# Patient Record
Sex: Female | Born: 1945 | Race: White | Hispanic: No | Marital: Married | Smoking: Never smoker
Health system: Southern US, Community
[De-identification: ages and names within clinical notes are randomized; demographics above are authoritative.]

## PROBLEM LIST (undated history)

## (undated) DIAGNOSIS — N921 Excessive and frequent menstruation with irregular cycle: Secondary | ICD-10-CM

## (undated) DIAGNOSIS — Z9289 Personal history of other medical treatment: Secondary | ICD-10-CM

## (undated) HISTORY — DX: Excessive and frequent menstruation with irregular cycle: N92.1

## (undated) HISTORY — DX: Personal history of other medical treatment: Z92.89

---

## 1989-06-24 HISTORY — PX: MANDIBLE SURGERY: SHX707

## 1989-08-24 HISTORY — PX: COMBINED HYSTEROSCOPY DIAGNOSTIC / D&C: SUR297

## 1996-03-26 HISTORY — PX: SHOULDER ARTHROSCOPY: SHX128

## 1997-09-25 ENCOUNTER — Other Ambulatory Visit: Admission: RE | Admit: 1997-09-25 | Discharge: 1997-09-25 | Payer: Self-pay | Admitting: *Deleted

## 1998-08-11 ENCOUNTER — Emergency Department (HOSPITAL_COMMUNITY): Admission: EM | Admit: 1998-08-11 | Discharge: 1998-08-11 | Payer: Self-pay | Admitting: Emergency Medicine

## 1998-10-13 ENCOUNTER — Other Ambulatory Visit: Admission: RE | Admit: 1998-10-13 | Discharge: 1998-10-13 | Payer: Self-pay | Admitting: *Deleted

## 1999-10-14 ENCOUNTER — Other Ambulatory Visit: Admission: RE | Admit: 1999-10-14 | Discharge: 1999-10-14 | Payer: Self-pay | Admitting: *Deleted

## 2000-10-17 ENCOUNTER — Other Ambulatory Visit: Admission: RE | Admit: 2000-10-17 | Discharge: 2000-10-17 | Payer: Self-pay | Admitting: *Deleted

## 2001-10-18 ENCOUNTER — Other Ambulatory Visit: Admission: RE | Admit: 2001-10-18 | Discharge: 2001-10-18 | Payer: Self-pay | Admitting: *Deleted

## 2002-10-24 ENCOUNTER — Other Ambulatory Visit: Admission: RE | Admit: 2002-10-24 | Discharge: 2002-10-24 | Payer: Self-pay | Admitting: *Deleted

## 2003-01-25 HISTORY — PX: KNEE ARTHROSCOPY: SHX127

## 2003-08-25 HISTORY — PX: KNEE ARTHROSCOPY: SHX127

## 2003-10-14 ENCOUNTER — Other Ambulatory Visit: Admission: RE | Admit: 2003-10-14 | Discharge: 2003-10-14 | Payer: Self-pay | Admitting: *Deleted

## 2004-10-14 ENCOUNTER — Other Ambulatory Visit: Admission: RE | Admit: 2004-10-14 | Discharge: 2004-10-14 | Payer: Self-pay | Admitting: *Deleted

## 2005-10-14 ENCOUNTER — Other Ambulatory Visit: Admission: RE | Admit: 2005-10-14 | Discharge: 2005-10-14 | Payer: Self-pay | Admitting: Obstetrics & Gynecology

## 2006-06-02 ENCOUNTER — Ambulatory Visit: Payer: Self-pay | Admitting: Internal Medicine

## 2006-06-15 ENCOUNTER — Ambulatory Visit: Payer: Self-pay | Admitting: Internal Medicine

## 2006-10-05 ENCOUNTER — Other Ambulatory Visit: Admission: RE | Admit: 2006-10-05 | Discharge: 2006-10-05 | Payer: Self-pay | Admitting: Obstetrics & Gynecology

## 2007-10-11 ENCOUNTER — Other Ambulatory Visit: Admission: RE | Admit: 2007-10-11 | Discharge: 2007-10-11 | Payer: Self-pay | Admitting: Obstetrics & Gynecology

## 2011-01-28 LAB — HM PAP SMEAR: HM Pap smear: NORMAL

## 2012-03-26 HISTORY — PX: MOHS SURGERY: SUR867

## 2012-10-30 ENCOUNTER — Ambulatory Visit (INDEPENDENT_AMBULATORY_CARE_PROVIDER_SITE_OTHER): Payer: 59 | Admitting: Family Medicine

## 2012-10-30 VITALS — BP 140/80 | HR 87 | Temp 98.0°F | Resp 18 | Ht 66.0 in | Wt 173.0 lb

## 2012-10-30 DIAGNOSIS — J069 Acute upper respiratory infection, unspecified: Secondary | ICD-10-CM

## 2012-10-30 DIAGNOSIS — J029 Acute pharyngitis, unspecified: Secondary | ICD-10-CM

## 2012-10-30 DIAGNOSIS — Z9109 Other allergy status, other than to drugs and biological substances: Secondary | ICD-10-CM

## 2012-10-30 LAB — POCT RAPID STREP A (OFFICE): Rapid Strep A Screen: NEGATIVE

## 2012-10-30 MED ORDER — AZITHROMYCIN 250 MG PO TABS: ORAL_TABLET | ORAL | Status: DC

## 2012-10-30 NOTE — Progress Notes (Signed)
  [facility]:  Office Visit  Chief Complaint:  Chief Complaint  Patient presents with  . Sore Throat    x 1 week     HPI: Elizabeth Moses is a 67 y.o. female who complains of   Sore throat x 1 week, she also has bumps along the inside of her oral mucosa History of fever blisters 10 years ago.  Has been around grandkids She is going on vacation to celebrate 49st anniversary and wants to get well  History reviewed. No pertinent past medical history. History reviewed. No pertinent past surgical history. History   Social History  . Marital Status: Married    Spouse Name: N/A    Number of Children: N/A  . Years of Education: N/A   Social History Main Topics  . Smoking status: Never Smoker   . Smokeless tobacco: None  . Alcohol Use: No  . Drug Use: No  . Sexually Active: None   Other Topics Concern  . None   Social History Narrative  . None   Family History  Problem Relation Age of Onset  . Heart disease Mother   . Heart disease Father    Allergies  Allergen Reactions  . Lodine (Etodolac)   . Penicillins    Prior to Admission medications   Not on File     ROS: The patient denies fevers, chills, night sweats, unintentional weight loss, chest pain, palpitations, wheezing, dyspnea on exertion, nausea, vomiting, abdominal pain, dysuria, hematuria, melena, numbness, weakness, or tingling.   All other systems have been reviewed and were otherwise negative with the exception of those mentioned in the HPI and as above.    PHYSICAL EXAM: Filed Vitals:   10/30/12 1137  BP: 140/80  Pulse: 87  Temp: 98 F (36.7 C)  Resp: 18   Filed Vitals:   10/30/12 1137  Height: 5\' 6"  (1.676 m)  Weight: 173 lb (78.472 kg)   Body mass index is 27.94 kg/(m^2).  General: Alert, no acute distress HEENT:  Normocephalic, atraumatic, oropharynx patent. + erythematous throat,no exudates. TM nl.  Cardiovascular:  Regular rate and rhythm, no rubs murmurs or  gallops.  No Carotid bruits, radial pulse intact. No pedal edema.  Respiratory: Clear to auscultation bilaterally.  No wheezes, rales, or rhonchi.  No cyanosis, no use of accessory musculature GI: No organomegaly, abdomen is soft and non-tender, positive bowel sounds.  No masses. Skin: No rashes. Neurologic: Facial musculature symmetric. Psychiatric: Patient is appropriate throughout our interaction. Lymphatic: No cervical lymphadenopathy Musculoskeletal: Gait intact.   LABS: Results for orders placed in visit on 10/30/12  POCT RAPID STREP A (OFFICE)      Result Value Range   Rapid Strep A Screen Negative  Negative     EKG/XRAY:   Primary read interpreted by Dr. Conley Rolls at [facility]   ASSESSMENT/PLAN: Encounter Diagnoses  Name Primary?  . Acute pharyngitis Yes  . History of environmental allergies   . URI, acute    Most likely allergy or viral related but patient is going out of town She is going to [place], [place] anniversary Sxs treatment first If no improvement then take abx Rx azithromycin Take otc antihistaime without D Gross sideeffects, risk and benefits, and alternatives of medications d/w patient. Patient is aware that all medications have potential sideeffects and we are unable to predict every sideeffect or drug-drug interaction that may occur. F/u prn   Berdena Cisek PHUONG, DO 10/30/2012 1:04 PM

## 2012-10-30 NOTE — Patient Instructions (Signed)
Allergies, Generic  Allergies may happen from anything your body is sensitive to. This may be food, medicines, pollens, chemicals, and nearly anything around you in everyday life that produces allergens. An allergen is anything that causes an allergy producing substance. Heredity is often a factor in causing these problems. This means you may have some of the same allergies as your parents.  Food allergies happen in all age groups. Food allergies are some of the most severe and life threatening. Some common food allergies are cow's milk, seafood, eggs, nuts, wheat, and soybeans.  SYMPTOMS    Swelling around the mouth.   An itchy red rash or hives.   Vomiting or diarrhea.   Difficulty breathing.  SEVERE ALLERGIC REACTIONS ARE LIFE-THREATENING.  This reaction is called anaphylaxis. It can cause the mouth and throat to swell and cause difficulty with breathing and swallowing. In severe reactions only a trace amount of food (for example, peanut oil in a salad) may cause death within seconds.  Seasonal allergies occur in all age groups. These are seasonal because they usually occur during the same season every year. They may be a reaction to molds, grass pollens, or tree pollens. Other causes of problems are house dust mite allergens, pet dander, and mold spores. The symptoms often consist of nasal congestion, a runny itchy nose associated with sneezing, and tearing itchy eyes. There is often an associated itching of the mouth and ears. The problems happen when you come in contact with pollens and other allergens. Allergens are the particles in the air that the body reacts to with an allergic reaction. This causes you to release allergic antibodies. Through a chain of events, these eventually cause you to release histamine into the blood stream. Although it is meant to be protective to the body, it is this release that causes your discomfort. This is why you were given anti-histamines to feel better. If you are  unable to pinpoint the offending allergen, it may be determined by skin or blood testing. Allergies cannot be cured but can be controlled with medicine.  Hay fever is a collection of all or some of the seasonal allergy problems. It may often be treated with simple over-the-counter medicine such as diphenhydramine. Take medicine as directed. Do not drink alcohol or drive while taking this medicine. Check with your caregiver or package insert for child dosages.  If these medicines are not effective, there are many new medicines your caregiver can prescribe. Stronger medicine such as nasal spray, eye drops, and corticosteroids may be used if the first things you try do not work well. Other treatments such as immunotherapy or desensitizing injections can be used if all else fails. Follow up with your caregiver if problems continue. These seasonal allergies are usually not life threatening. They are generally more of a nuisance that can often be handled using medicine.  HOME CARE INSTRUCTIONS    If unsure what causes a reaction, keep a diary of foods eaten and symptoms that follow. Avoid foods that cause reactions.   If hives or rash are present:   Take medicine as directed.   You may use an over-the-counter antihistamine (diphenhydramine) for hives and itching as needed.   Apply cold compresses (cloths) to the skin or take baths in cool water. Avoid hot baths or showers. Heat will make a rash and itching worse.   If you are severely allergic:   Following a treatment for a severe reaction, hospitalization is often required for closer follow-up.     use an anaphylaxis kit.  If you have had a severe reaction, always carry your anaphylaxis kit or EpiPen with you. Use this medicine as directed by your caregiver if a severe reaction is occurring. Failure to do so could have a fatal outcome. SEEK  MEDICAL CARE IF:  You suspect a food allergy. Symptoms generally happen within 30 minutes of eating a food.  Your symptoms have not gone away within 2 days or are getting worse.  You develop new symptoms.  You want to retest yourself or your child with a food or drink you think causes an allergic reaction. Never do this if an anaphylactic reaction to that food or drink has happened before. Only do this under the care of a caregiver. SEEK IMMEDIATE MEDICAL CARE IF:   You have difficulty breathing, are wheezing, or have a tight feeling in your chest or throat.  You have a swollen mouth, or you have hives, swelling, or itching all over your body.  You have had a severe reaction that has responded to your anaphylaxis kit or an EpiPen. These reactions may return when the medicine has worn off. These reactions should be considered life threatening. MAKE SURE YOU:   Understand these instructions.  Will watch your condition.  Will get help right away if you are not doing well or get worse. Document Released: 07/06/2002 Document Revised: 07/05/2011 Document Reviewed: 12/11/2007 [facility] Patient Information 2014 [place], [place]. Viral and Bacterial Pharyngitis Pharyngitis is soreness (inflammation) or infection of the pharynx. It is also called a sore throat. CAUSES  Most sore throats are caused by viruses and are part of a cold. However, some sore throats are caused by strep and other bacteria. Sore throats can also be caused by post nasal drip from draining sinuses, allergies and sometimes from sleeping with an open mouth. Infectious sore throats can be spread from person to person by coughing, sneezing and sharing cups or eating utensils. TREATMENT  Sore throats that are viral usually last 3-4 days. Viral illness will get better without medications (antibiotics). Strep throat and other bacterial infections will usually begin to get better about 24-48 hours after you begin to take  antibiotics. HOME CARE INSTRUCTIONS   If the caregiver feels there is a bacterial infection or if there is a positive strep test, they will prescribe an antibiotic. The full course of antibiotics must be taken. If the full course of antibiotic is not taken, you or your child may become ill again. If you or your child has strep throat and do not finish all of the medication, serious heart or kidney diseases may develop.  Drink enough water and fluids to keep your urine clear or pale yellow.  Only take over-the-counter or prescription medicines for pain, discomfort or fever as directed by your caregiver.  Get lots of rest.  Gargle with salt water ( tsp. of salt in a glass of water) as often as every 1-2 hours as you need for comfort.  Hard candies may soothe the throat if individual is not at risk for choking. Throat sprays or lozenges may also be used. SEEK MEDICAL CARE IF:   Large, tender lumps in the neck develop.  A rash develops.  Green, yellow-brown or bloody sputum is coughed up.  Your baby is older than 3 months with a rectal temperature of 100.5 F (38.1 C) or higher for more than 1 day. SEEK IMMEDIATE MEDICAL CARE IF:   A stiff neck develops.  You or your child are drooling  or unable to swallow liquids.  You or your child are vomiting, unable to keep medications or liquids down.  You or your child has severe pain, unrelieved with recommended medications.  You or your child are having difficulty breathing (not due to stuffy nose).  You or your child are unable to fully open your mouth.  You or your child develop redness, swelling, or severe pain anywhere on the neck.  You have a fever.  Your baby is older than 3 months with a rectal temperature of 102 F (38.9 C) or higher.  Your baby is 38 months old or younger with a rectal temperature of 100.4 F (38 C) or higher. MAKE SURE YOU:   Understand these instructions.  Will watch your condition.  Will get help  right away if you are not doing well or get worse. Document Released: 04/12/2005 Document Revised: 07/05/2011 Document Reviewed: 07/10/2007 [facility] Patient Information 2014 [place], [place].

## 2013-05-15 ENCOUNTER — Encounter: Payer: Self-pay | Admitting: Obstetrics & Gynecology

## 2013-05-17 ENCOUNTER — Ambulatory Visit (INDEPENDENT_AMBULATORY_CARE_PROVIDER_SITE_OTHER): Payer: 59 | Admitting: Obstetrics & Gynecology

## 2013-05-17 ENCOUNTER — Encounter: Payer: Self-pay | Admitting: Obstetrics & Gynecology

## 2013-05-17 VITALS — BP 144/82 | HR 72 | Resp 16 | Ht 65.0 in | Wt 160.6 lb

## 2013-05-17 DIAGNOSIS — Z124 Encounter for screening for malignant neoplasm of cervix: Secondary | ICD-10-CM

## 2013-05-17 DIAGNOSIS — Z01419 Encounter for gynecological examination (general) (routine) without abnormal findings: Secondary | ICD-10-CM

## 2013-05-17 NOTE — Progress Notes (Signed)
68 y.o. [id number] MarriedCaucasianF here for annual exam.  Doing well.  No vaginal bleeding.  Patient so kind and asked lots of questions about my son.  She just went to dermatologist on Tuesday--1/19.  Has several areas biopsies.    Oldest grandson getting married in [place] in the spring.    Has appt with Dr. Felicity CoyerLeschber in June.  No blood work today.    Patient's last menstrual period was 04/27/1995.          Sexually active: no  The current method of family planning is none.    Exercising: yes  tennis, bike, weight, aerobics, and walking Smoker:  no  Health Maintenance: Pap:  01/28/11 WNL History of abnormal Pap:  no MMG:  04/10/13 3D-normal Colonoscopy:  2008 repeat in 10 years, Dr. Juanda ChanceBrodie BMD:   2008 TDaP:  02/17/12 Screening Labs: PCP, Hb today: PCP, Urine today: PCP   reports that she has never smoked. She has never used smokeless tobacco. She reports that she does not drink alcohol or use illicit drugs.  Past Medical History  Diagnosis Date  . Menometrorrhagia 05/1988  . History of blood transfusion     As a child from father    Past Surgical History  Procedure Laterality Date  . Mandible surgery  06/1989    Reconstructive  . Combined hysteroscopy diagnostic / d&c  08/1989    Benign  . Shoulder arthroscopy Left 03/1996  . Knee arthroscopy Right 01/2003  . Knee arthroscopy Left 08/2003  . Colonoscopy  05/2006    F/U in 10 years  . Mohs surgery  12/13    on forehead  . Skin biopsy  7/14  . Skin biopsy  05/15/13    Current Outpatient Prescriptions  Medication Sig Dispense Refill  . Multiple Vitamin (MULTIVITAMIN) tablet Take 1 tablet by mouth daily.      . Omega-3 Fatty Acids (FISH OIL PO) Take by mouth daily.       No current facility-administered medications for this visit.    Family History  Problem Relation Age of Onset  . Heart disease Mother   . Hypertension Mother   . Thyroid disease Mother   . Heart disease Father   . Epilepsy Brother   . Heart  disease Maternal Grandfather [id number]  . Heart disease Paternal Grandfather [id number]  . Other Sister     breast biopsy    ROS:  Pertinent items are noted in HPI.  Otherwise, a comprehensive ROS was negative.  Exam:   BP 144/82  Pulse 72  Resp 16  Ht 5\' 5"  (1.651 m)  Wt 160 lb 9.6 oz (72.848 kg)  BMI 26.73 kg/m2  LMP 04/27/1995  Weight change: +3lbs  Height: 5\' 5"  (165.1 cm)  Ht Readings from Last 3 Encounters:  05/17/13 5\' 5"  (1.651 m)  10/30/12 5\' 6"  (1.676 m)    General appearance: alert, cooperative and appears stated age Head: Normocephalic, without obvious abnormality, atraumatic Neck: no adenopathy, supple, symmetrical, trachea midline and thyroid normal to inspection and palpation Lungs: clear to auscultation bilaterally Breasts: normal appearance, no masses or tenderness Heart: regular rate and rhythm Abdomen: soft, non-tender; bowel sounds normal; no masses,  no organomegaly Extremities: extremities normal, atraumatic, no cyanosis or edema Skin: Skin color, texture, turgor normal. No rashes or lesions Lymph nodes: Cervical, supraclavicular, and axillary nodes normal. No abnormal inguinal nodes palpated Neurologic: Grossly normal   Pelvic: External genitalia:  no lesions  Urethra:  normal appearing urethra with no masses, tenderness or lesions              Bartholins and Skenes: normal                 Vagina: normal appearing vagina with normal color and discharge, no lesions              Cervix: no lesions              Pap taken: yes Bimanual Exam:  Uterus:  normal size, contour, position, consistency, mobility, non-tender              Adnexa: normal adnexa and no mass, fullness, tenderness               Rectovaginal: Confirms               Anus:  normal sphincter tone, no lesions  A:  Well Woman with normal exam PMP. No HRT Dense breasts, grade 3 H/O SCCs of skin  P:   Mammogram yearly.  3D discussed. pap smear obtained today.  Pt aware of guidelines and  requests that we do not stop Pap smears completely. Labs will be done with Dr. Trish Mage this summer.  Will discuss vaccines with her as well.  Tdap done 2013. return annually or prn  An After Visit Summary was printed and given to the patient.

## 2013-05-18 LAB — IPS PAP SMEAR ONLY

## 2013-08-28 ENCOUNTER — Ambulatory Visit (INDEPENDENT_AMBULATORY_CARE_PROVIDER_SITE_OTHER): Payer: 59 | Admitting: Certified Nurse Midwife

## 2013-08-28 ENCOUNTER — Encounter: Payer: Self-pay | Admitting: Certified Nurse Midwife

## 2013-08-28 VITALS — BP 124/70 | HR 76 | Temp 97.9°F | Resp 16 | Ht 65.0 in | Wt 158.0 lb

## 2013-08-28 DIAGNOSIS — N390 Urinary tract infection, site not specified: Secondary | ICD-10-CM

## 2013-08-28 LAB — POCT URINALYSIS DIPSTICK
BILIRUBIN UA: NEGATIVE
Blood, UA: NEGATIVE
Glucose, UA: NEGATIVE
KETONES UA: NEGATIVE
NITRITE UA: NEGATIVE
PH UA: 5
PROTEIN UA: NEGATIVE
Urobilinogen, UA: NEGATIVE

## 2013-08-28 MED ORDER — NITROFURANTOIN MONOHYD MACRO 100 MG PO CAPS: 100.0000 mg | ORAL_CAPSULE | Freq: Two times a day (BID) | ORAL | Status: DC

## 2013-08-28 NOTE — Progress Notes (Signed)
Reviewed personally.  M. Suzanne Seyed Heffley, MD.  

## 2013-08-28 NOTE — Patient Instructions (Signed)
Urinary Tract Infection  Urinary tract infections (UTIs) can develop anywhere along your urinary tract. Your urinary tract is your body's drainage system for removing wastes and extra water. Your urinary tract includes two kidneys, two ureters, a bladder, and a urethra. Your kidneys are a pair of bean-shaped organs. Each kidney is about the size of your fist. They are located below your ribs, one on each side of your spine.  CAUSES  Infections are caused by microbes, which are microscopic organisms, including fungi, viruses, and bacteria. These organisms are so small that they can only be seen through a microscope. Bacteria are the microbes that most commonly cause UTIs.  SYMPTOMS   Symptoms of UTIs may vary by age and gender of the patient and by the location of the infection. Symptoms in young women typically include a frequent and intense urge to urinate and a painful, burning feeling in the bladder or urethra during urination. Older women and men are more likely to be tired, shaky, and weak and have muscle aches and abdominal pain. A fever may mean the infection is in your kidneys. Other symptoms of a kidney infection include pain in your back or sides below the ribs, nausea, and vomiting.  DIAGNOSIS  To diagnose a UTI, your caregiver will ask you about your symptoms. Your caregiver also will ask to provide a urine sample. The urine sample will be tested for bacteria and white blood cells. White blood cells are made by your body to help fight infection.  TREATMENT   Typically, UTIs can be treated with medication. Because most UTIs are caused by a bacterial infection, they usually can be treated with the use of antibiotics. The choice of antibiotic and length of treatment depend on your symptoms and the type of bacteria causing your infection.  HOME CARE INSTRUCTIONS   If you were prescribed antibiotics, take them exactly as your caregiver instructs you. Finish the medication even if you feel better after you  have only taken some of the medication.   Drink enough water and fluids to keep your urine clear or pale yellow.   Avoid caffeine, tea, and carbonated beverages. They tend to irritate your bladder.   Empty your bladder often. Avoid holding urine for long periods of time.   Empty your bladder before and after sexual intercourse.   After a bowel movement, women should cleanse from front to back. Use each tissue only once.  SEEK MEDICAL CARE IF:    You have back pain.   You develop a fever.   Your symptoms do not begin to resolve within 3 days.  SEEK IMMEDIATE MEDICAL CARE IF:    You have severe back pain or lower abdominal pain.   You develop chills.   You have nausea or vomiting.   You have continued burning or discomfort with urination.  MAKE SURE YOU:    Understand these instructions.   Will watch your condition.   Will get help right away if you are not doing well or get worse.  Document Released: 01/20/2005 Document Revised: 10/12/2011 Document Reviewed: 05/21/2011  [employer] Patient Information 2014 [employer].

## 2013-08-28 NOTE — Progress Notes (Signed)
68 y.o.married white female [id number] here with complaint of UTI, with onset  on 3 days ago. Patient complaining of urinary frequency/urgency/ and pain with urination. Patient denies fever, chills, nausea or back pain. No new personal products. Patient not sexual activity. Denies any vaginal symptoms or new personal products.  Menopausal with slight vaginal dryness.   O: Healthy female WDWN Affect: Normal, orientation x 3 Skin : warm and dry CVAT: negative bilateral Abdomen: positive for suprapubic tenderness  Pelvic exam: External genital area: normal, no lesions Bladder,Urethra, Urethral meatus: tender,meatus slightly red Vagina: normal vaginal discharge, slight atrophic appearance  Cervix: normal, non tender Uterus:normal,non tender Adnexa: normal non tender, no fullness or masses  Poct urine-wbc trace A: UTI ? Vaginal dyness  P: Reviewed findings of UTI ZH:[id number]:Macrobid see order NGE:XBMWULab:Urine micro, culture Reviewed warning signs and symptoms of UTI Encouraged to limit soda, tea, and coffee and increase water RV 2 week if TOC needed for positive culture Discussed vaginal findings and encouraged Olive oil use 2 x weekly for moisture . Patient will try after UTI treated.  RV prn

## 2013-08-29 LAB — URINALYSIS, MICROSCOPIC ONLY
Bacteria, UA: NONE SEEN
CASTS: NONE SEEN
CRYSTALS: NONE SEEN
Squamous Epithelial / LPF: NONE SEEN

## 2013-08-29 LAB — URINE CULTURE
COLONY COUNT: NO GROWTH
Organism ID, Bacteria: NO GROWTH

## 2013-08-30 ENCOUNTER — Telehealth: Payer: Self-pay

## 2013-08-30 NOTE — Telephone Encounter (Signed)
Message copied by Eliezer BottomJOHNSON, Gasper Hopes J on Thu Aug 30, 2013  2:29 PM ------      Message from: Verner CholLEONARD, DEBORAH S      Created: Thu Aug 30, 2013  8:22 AM       Notify patent that urine culture and micro negative, but symptomatic when in, so complete medication. Vaginal dryness contributing problem. No TOC needed.      Patent status ------

## 2013-08-30 NOTE — Telephone Encounter (Signed)
lmtcb

## 2013-08-31 NOTE — Telephone Encounter (Signed)
Spoke with patient. Results given as seen below. Patient agreeable and verbalizes understanding.  Routing to provider for final review. Patient agreeable to disposition. Will close encounter   

## 2013-08-31 NOTE — Telephone Encounter (Signed)
Patient is returning joys call but she isnt here could you help patient. Said she may just have to call back because she is playing in a tennis match at the moment

## 2013-08-31 NOTE — Telephone Encounter (Signed)
Left message to call Elizabeth Moses at [phone]. 

## 2013-09-28 ENCOUNTER — Ambulatory Visit: Payer: Self-pay | Admitting: Internal Medicine

## 2014-02-21 ENCOUNTER — Ambulatory Visit (INDEPENDENT_AMBULATORY_CARE_PROVIDER_SITE_OTHER): Payer: 59 | Admitting: Certified Nurse Midwife

## 2014-02-21 ENCOUNTER — Encounter: Payer: Self-pay | Admitting: Certified Nurse Midwife

## 2014-02-21 VITALS — BP 120/80 | HR 72 | Resp 16 | Ht 65.0 in | Wt 148.0 lb

## 2014-02-21 DIAGNOSIS — N390 Urinary tract infection, site not specified: Secondary | ICD-10-CM

## 2014-02-21 MED ORDER — NITROFURANTOIN MONOHYD MACRO 100 MG PO CAPS: 100.0000 mg | ORAL_CAPSULE | Freq: Two times a day (BID) | ORAL | Status: DC

## 2014-02-21 NOTE — Patient Instructions (Signed)

## 2014-02-21 NOTE — Progress Notes (Signed)
Reviewed personally.  M. Suzanne Adelee Hannula, MD.  

## 2014-02-21 NOTE — Progress Notes (Signed)
68 y.o. married white female [id number] here with complaint of urinary symptoms of burning and throbbing in urethral area.  Denies urinary frequency or urgency or incontinence. Denies fever,chills or back pain. Patient does have vaginal dryness and uses OTC prn. Patient very athletic "sweats a lot". Has a tennis competition coming up soon. No other issues today.  O:Healthy female WDWN Affect: normal, orientation x 3  Exam:Skin warm and dry CVAT negative bilateral Abdomen:slight suprapubic Lymph node: no enlargement or tenderness Pelvic exam: External genital:atrophic appearance Bladder,urethra tender, urethral meatus red, tender Vagina: scant discharge noted.  Cervix: normal, non tender Uterus: normal, non tender Adnexa:normal, non tender, no masses or fullness noted  POCT urine RBC 1+ WBC 2+   A:Urethritis  R/O UTI   P:Discussed findings of urethritis and etiology. Discussed menopausal changes and moisture can contribute to occurrence. Instructed to increase Coconut oil use around vaginal opening and change out of wet underwear more frequently when playing sports. Increase water intake and decrease coffee and tea. UTI warning symptoms reviewed. Rx: Macrobid see order Lab Urine micro,culture  Rv prn

## 2014-02-22 LAB — URINALYSIS, MICROSCOPIC ONLY
BACTERIA UA: NONE SEEN
Casts: NONE SEEN
Crystals: NONE SEEN
Squamous Epithelial / LPF: NONE SEEN

## 2014-02-23 LAB — URINE CULTURE
Colony Count: NO GROWTH
Organism ID, Bacteria: NO GROWTH

## 2014-02-25 ENCOUNTER — Encounter: Payer: Self-pay | Admitting: Certified Nurse Midwife

## 2014-05-30 ENCOUNTER — Ambulatory Visit (INDEPENDENT_AMBULATORY_CARE_PROVIDER_SITE_OTHER): Payer: Medicare Other | Admitting: Obstetrics & Gynecology

## 2014-05-30 ENCOUNTER — Encounter: Payer: Self-pay | Admitting: Obstetrics & Gynecology

## 2014-05-30 VITALS — BP 158/82 | HR 80 | Resp 16 | Ht 65.25 in | Wt 143.8 lb

## 2014-05-30 DIAGNOSIS — Z0000 Encounter for general adult medical examination without abnormal findings: Secondary | ICD-10-CM

## 2014-05-30 DIAGNOSIS — E785 Hyperlipidemia, unspecified: Secondary | ICD-10-CM

## 2014-05-30 DIAGNOSIS — E038 Other specified hypothyroidism: Secondary | ICD-10-CM

## 2014-05-30 DIAGNOSIS — Z01419 Encounter for gynecological examination (general) (routine) without abnormal findings: Secondary | ICD-10-CM

## 2014-05-30 LAB — COMPREHENSIVE METABOLIC PANEL
ALBUMIN: 4.2 g/dL (ref 3.5–5.2)
ALT: 11 U/L (ref 0–35)
AST: 19 U/L (ref 0–37)
Alkaline Phosphatase: 61 U/L (ref 39–117)
BUN: 15 mg/dL (ref 6–23)
CO2: 27 mEq/L (ref 19–32)
Calcium: 9.8 mg/dL (ref 8.4–10.5)
Chloride: 103 mEq/L (ref 96–112)
Creat: 0.94 mg/dL (ref 0.50–1.10)
Glucose, Bld: 94 mg/dL (ref 70–99)
Potassium: 4 mEq/L (ref 3.5–5.3)
SODIUM: 138 meq/L (ref 135–145)
TOTAL PROTEIN: 7.3 g/dL (ref 6.0–8.3)
Total Bilirubin: 0.6 mg/dL (ref 0.2–1.2)

## 2014-05-30 LAB — LIPID PANEL
Cholesterol: 199 mg/dL (ref 0–200)
HDL: 56 mg/dL (ref >39)
LDL Cholesterol: 124 mg/dL — ABNORMAL HIGH (ref 0–99)
Total CHOL/HDL Ratio: 3.6 Ratio
Triglycerides: 97 mg/dL (ref <150)
VLDL: 19 mg/dL (ref 0–40)

## 2014-05-30 NOTE — Progress Notes (Signed)
69 y.o. [id number] MarriedCaucasianF here for annual exam.  Doing well.  No vaginal bleeding.  Family doing well.  Lucila [place]son has finished [employer].  Will be [place] to [place] in the summer.  Exercising regularly.  Have highly recommend pt establish care with PCP.  Pt had appt with Dr. Felicity CoyerLeschber but didn't go last year due to grandson's graduation from [employer].    Pt checks BP at home.  Values are all 110's-120's/60's-70's.  Lost 17 pounds from last visit.  She has purposefully worked on this.    Patient's last menstrual period was 04/27/1995.          Sexually active: No.  The current method of family planning is post menopausal status.    Exercising: Yes.     Smoker:  no  Health Maintenance: Pap:  05/17/13 WNL History of abnormal Pap:  no MMG:  05/28/14 3D-normal Colonoscopy:  2008-repeat in 10 years BMD:   2008 TDaP:  02/17/12 Screening Labs: done today, Hb today: 13.9, Urine today: unable to give sample   reports that she has never smoked. She has never used smokeless tobacco. She reports that she does not drink alcohol or use illicit drugs.  Past Medical History  Diagnosis Date  . Menometrorrhagia 05/1988  . History of blood transfusion     As a child from father    Past Surgical History  Procedure Laterality Date  . Mandible surgery  06/1989    Reconstructive  . Combined hysteroscopy diagnostic / d&c  08/1989    Benign  . Shoulder arthroscopy Left 03/1996  . Knee arthroscopy Right 01/2003  . Knee arthroscopy Left 08/2003  . Mohs surgery  12/13    on forehead    Current Outpatient Prescriptions  Medication Sig Dispense Refill  . B Complex Vitamins (B-COMPLEX/B-12 PO) Take by mouth as needed.    . Coenzyme Q10 (CO Q 10 PO) Take by mouth daily.    . Multiple Vitamin (MULTIVITAMIN) tablet Take 1 tablet by mouth daily.    . Omega-3 Fatty Acids (FISH OIL PO) Take by mouth daily.    Marland Kitchen. VITAMIN E PO Take by mouth daily.     No current facility-administered  medications for this visit.    Family History  Problem Relation Age of Onset  . Heart disease Mother   . Hypertension Mother   . Thyroid disease Mother   . Heart disease Father   . Epilepsy Brother   . Heart disease Maternal Grandfather [id number]  . Heart disease Paternal Grandfather [id number]  . Other Sister     breast biopsy  . Osteoporosis Sister     ROS:  Pertinent items are noted in HPI.  Otherwise, a comprehensive ROS was negative.  Exam:   BP 158/82 mmHg  Pulse 80  Resp 16  Ht 5' 5.25" (1.657 m)  Wt 143 lb 12.8 oz (65.227 kg)  BMI 23.76 kg/m2  LMP 04/27/1995    Height: 5' 5.25" (165.7 cm)  Ht Readings from Last 3 Encounters:  05/30/14 5' 5.25" (1.657 m)  02/21/14 5\' 5"  (1.651 m)  08/28/13 5\' 5"  (1.651 m)    General appearance: alert, cooperative and appears stated age Head: Normocephalic, without obvious abnormality, atraumatic Neck: no adenopathy, supple, symmetrical, trachea midline and thyroid normal to inspection and palpation Lungs: clear to auscultation bilaterally Breasts: normal appearance, no masses or tenderness Heart: regular rate and rhythm Abdomen: soft, non-tender; bowel sounds normal; no masses,  no organomegaly Extremities: extremities normal, atraumatic, no cyanosis or  edema Skin: Skin color, texture, turgor normal. No rashes or lesions Lymph nodes: Cervical, supraclavicular, and axillary nodes normal. No abnormal inguinal nodes palpated Neurologic: Grossly normal   Pelvic: External genitalia:  no lesions              Urethra:  normal appearing urethra with no masses, tenderness or lesions              Bartholins and Skenes: normal                 Vagina: normal appearing vagina with normal color and discharge, no lesions              Cervix: no lesions              Pap taken: No. Bimanual Exam:  Uterus:  normal size, contour, position, consistency, mobility, non-tender              Adnexa: normal adnexa and no mass, fullness, tenderness                Rectovaginal: Confirms               Anus:  normal sphincter tone, no lesions  Chaperone was present for exam.  A:  Well Woman with normal exam PMP. No HRT Dense breasts, grade 3 H/O SCCs of skin Mildly elevated lipids  P: Mammogram yearly. Doing 3D MMG.   Pap 2015.  No pap today. Will schedule BMD for pt.   Screening labs will be done today.  CMP, Lipids, TSH.  Encouraged pt to establish care with PCP. Tdap done 2013. return annually or prn

## 2014-05-31 ENCOUNTER — Telehealth: Payer: Self-pay

## 2014-05-31 LAB — HEMOGLOBIN, FINGERSTICK: HEMOGLOBIN, FINGERSTICK: 13.9 g/dL (ref 12.0–16.0)

## 2014-05-31 LAB — TSH: TSH: 6.452 u[IU]/mL — ABNORMAL HIGH (ref 0.350–4.500)

## 2014-05-31 NOTE — Telephone Encounter (Signed)
lmtcb-let her know [facility] at 9:30, arrive at 9:15.//kn

## 2014-06-04 NOTE — Addendum Note (Signed)
Addended by: Jerene BearsMILLER, Keisuke Hollabaugh S on: 06/04/2014 07:15 AM   Modules accepted: Orders, SmartSet

## 2014-06-18 NOTE — Telephone Encounter (Signed)
Patient had both done//kn

## 2014-06-24 ENCOUNTER — Telehealth: Payer: Self-pay

## 2014-06-24 NOTE — Telephone Encounter (Signed)
Returning call.

## 2014-06-24 NOTE — Telephone Encounter (Signed)
Patient notified of BMD results.//kn 

## 2014-06-24 NOTE — Telephone Encounter (Signed)
Lmtcb//kn 

## 2014-06-24 NOTE — Telephone Encounter (Signed)
Patient notified of BD results from [facility]-copy will be scanned into epic. Patient states does have a new PCP, Dr Genella MechElizabeth Kollar, would like to have the last 2 office visit notes and lab work sent to her. Patient aware to sign release while here for recheck lab work 07/04/14 and we will fax it to her office.//kn

## 2014-07-04 ENCOUNTER — Other Ambulatory Visit (INDEPENDENT_AMBULATORY_CARE_PROVIDER_SITE_OTHER): Payer: Medicare Other

## 2014-07-04 DIAGNOSIS — E038 Other specified hypothyroidism: Secondary | ICD-10-CM

## 2014-07-04 LAB — THYROID PANEL WITH TSH
Free Thyroxine Index: 1.6 (ref 1.4–3.8)
T3 UPTAKE: 28 % (ref 22–35)
T4, Total: 5.6 ug/dL (ref 4.5–12.0)
TSH: 3.45 u[IU]/mL (ref 0.350–4.500)

## 2014-07-18 ENCOUNTER — Encounter: Payer: Self-pay | Admitting: Internal Medicine

## 2014-07-18 ENCOUNTER — Ambulatory Visit (INDEPENDENT_AMBULATORY_CARE_PROVIDER_SITE_OTHER): Payer: Medicare Other | Admitting: Internal Medicine

## 2014-07-18 ENCOUNTER — Encounter (INDEPENDENT_AMBULATORY_CARE_PROVIDER_SITE_OTHER): Payer: Self-pay

## 2014-07-18 VITALS — BP 158/82 | HR 72 | Temp 97.9°F | Resp 18 | Ht 65.0 in | Wt 145.2 lb

## 2014-07-18 DIAGNOSIS — Z23 Encounter for immunization: Secondary | ICD-10-CM | POA: Diagnosis not present

## 2014-07-18 DIAGNOSIS — Z418 Encounter for other procedures for purposes other than remedying health state: Secondary | ICD-10-CM | POA: Diagnosis not present

## 2014-07-18 DIAGNOSIS — Z0000 Encounter for general adult medical examination without abnormal findings: Secondary | ICD-10-CM

## 2014-07-18 DIAGNOSIS — IMO0001 Reserved for inherently not codable concepts without codable children: Secondary | ICD-10-CM

## 2014-07-18 DIAGNOSIS — Z299 Encounter for prophylactic measures, unspecified: Secondary | ICD-10-CM

## 2014-07-18 DIAGNOSIS — R030 Elevated blood-pressure reading, without diagnosis of hypertension: Secondary | ICD-10-CM | POA: Insufficient documentation

## 2014-07-18 NOTE — Progress Notes (Signed)
Pre visit review using our clinic review tool, if applicable. No additional management support is needed unless otherwise documented below in the visit note. 

## 2014-07-18 NOTE — Assessment & Plan Note (Signed)
She is up to date on colonoscopy (due in 2018), has had flu shot this season. Given pneumovac today and will give prevnar next year. Mammogram and bone density up to date. Shingles done. Tetanus due in 2023. Given list of screening for next 10+ years. Reminded her about sun safety as she has had multiple lesions treated by dermatology and needs every 6 months check up with full body inspection. She is very active, non-smoker.

## 2014-07-18 NOTE — Assessment & Plan Note (Signed)
Per patient's records she has BP 120-130/70s at home. Will continue to monitor and have her let us know if the BPs elevate at home. Checking labs today.

## 2014-07-18 NOTE — Patient Instructions (Signed)
We have cleaned out the ears today. Sometimes ringing in the ears can be caused by the anatomy of the blood vessels and how close they run to the ear bones.   We will see you back next year, if you have any problems or questions before then please feel free to call the office.   You are doing great with your health so keep up the good work.   Health Maintenance Adopting a healthy lifestyle and getting preventive care can go a long way to promote health and wellness. Talk with your health care provider about what schedule of regular examinations is right for you. This is a good chance for you to check in with your provider about disease prevention and staying healthy. In between checkups, there are plenty of things you can do on your own. Experts have done a lot of research about which lifestyle changes and preventive measures are most likely to keep you healthy. Ask your health care provider for more information. WEIGHT AND DIET  Eat a healthy diet  Be sure to include plenty of vegetables, fruits, low-fat dairy products, and lean protein.  Do not eat a lot of foods high in solid fats, added sugars, or salt.  Get regular exercise. This is one of the most important things you can do for your health.  Most adults should exercise for at least 150 minutes each week. The exercise should increase your heart rate and make you sweat (moderate-intensity exercise).  Most adults should also do strengthening exercises at least twice a week. This is in addition to the moderate-intensity exercise.  Maintain a healthy weight  Body mass index (BMI) is a measurement that can be used to identify possible weight problems. It estimates body fat based on height and weight. Your health care provider can help determine your BMI and help you achieve or maintain a healthy weight.  For females 43 years of age and older:   A BMI below 18.5 is considered underweight.  A BMI of 18.5 to 24.9 is normal.  A BMI of  25 to 29.9 is considered overweight.  A BMI of 30 and above is considered obese.  Watch levels of cholesterol and blood lipids  You should start having your blood tested for lipids and cholesterol at 71 years of age, then have this test every 5 years.  You may need to have your cholesterol levels checked more often if:  Your lipid or cholesterol levels are high.  You are older than 71 years of age.  You are at high risk for heart disease.  CANCER SCREENING   Lung Cancer  Lung cancer screening is recommended for adults 75-15 years old who are at high risk for lung cancer because of a history of smoking.  A yearly low-dose CT scan of the lungs is recommended for people who:  Currently smoke.  Have quit within the past 15 years.  Have at least a 30-pack-year history of smoking. A pack year is smoking an average of one pack of cigarettes a day for 1 year.  Yearly screening should continue until it has been 15 years since you quit.  Yearly screening should stop if you develop a health problem that would prevent you from having lung cancer treatment.  Breast Cancer  Practice breast self-awareness. This means understanding how your breasts normally appear and feel.  It also means doing regular breast self-exams. Let your health care provider know about any changes, no matter how small.  If you  are in your 20s or 30s, you should have a clinical breast exam (CBE) by a health care provider every 1-3 years as part of a regular health exam.  If you are 85 or older, have a CBE every year. Also consider having a breast X-ray (mammogram) every year.  If you have a family history of breast cancer, talk to your health care provider about genetic screening.  If you are at high risk for breast cancer, talk to your health care provider about having an MRI and a mammogram every year.  Breast cancer gene (BRCA) assessment is recommended for women who have family members with BRCA-related  cancers. BRCA-related cancers include:  Breast.  Ovarian.  Tubal.  Peritoneal cancers.  Results of the assessment will determine the need for genetic counseling and BRCA1 and BRCA2 testing. Cervical Cancer Routine pelvic examinations to screen for cervical cancer are no longer recommended for nonpregnant women who are considered low risk for cancer of the pelvic organs (ovaries, uterus, and vagina) and who do not have symptoms. A pelvic examination may be necessary if you have symptoms including those associated with pelvic infections. Ask your health care provider if a screening pelvic exam is right for you.   The Pap test is the screening test for cervical cancer for women who are considered at risk.  If you had a hysterectomy for a problem that was not cancer or a condition that could lead to cancer, then you no longer need Pap tests.  If you are older than 65 years, and you have had normal Pap tests for the past 10 years, you no longer need to have Pap tests.  If you have had past treatment for cervical cancer or a condition that could lead to cancer, you need Pap tests and screening for cancer for at least 20 years after your treatment.  If you no longer get a Pap test, assess your risk factors if they change (such as having a new sexual partner). This can affect whether you should start being screened again.  Some women have medical problems that increase their chance of getting cervical cancer. If this is the case for you, your health care provider may recommend more frequent screening and Pap tests.  The human papillomavirus (HPV) test is another test that may be used for cervical cancer screening. The HPV test looks for the virus that can cause cell changes in the cervix. The cells collected during the Pap test can be tested for HPV.  The HPV test can be used to screen women 22 years of age and older. Getting tested for HPV can extend the interval between normal Pap tests from  three to five years.  An HPV test also should be used to screen women of any age who have unclear Pap test results.  After 71 years of age, women should have HPV testing as often as Pap tests.  Colorectal Cancer  This type of cancer can be detected and often prevented.  Routine colorectal cancer screening usually begins at 71 years of age and continues through 71 years of age.  Your health care provider may recommend screening at an earlier age if you have risk factors for colon cancer.  Your health care provider may also recommend using home test kits to check for hidden blood in the stool.  A small camera at the end of a tube can be used to examine your colon directly (sigmoidoscopy or colonoscopy). This is done to check for the earliest  forms of colorectal cancer.  Routine screening usually begins at age 74.  Direct examination of the colon should be repeated every 5-10 years through 71 years of age. However, you may need to be screened more often if early forms of precancerous polyps or small growths are found. Skin Cancer  Check your skin from head to toe regularly.  Tell your health care provider about any new moles or changes in moles, especially if there is a change in a mole's shape or color.  Also tell your health care provider if you have a mole that is larger than the size of a pencil eraser.  Always use sunscreen. Apply sunscreen liberally and repeatedly throughout the day.  Protect yourself by wearing long sleeves, pants, a wide-brimmed hat, and sunglasses whenever you are outside. HEART DISEASE, DIABETES, AND HIGH BLOOD PRESSURE   Have your blood pressure checked at least every 1-2 years. High blood pressure causes heart disease and increases the risk of stroke.  If you are between 70 years and 79 years old, ask your health care provider if you should take aspirin to prevent strokes.  Have regular diabetes screenings. This involves taking a blood sample to check  your fasting blood sugar level.  If you are at a normal weight and have a low risk for diabetes, have this test once every three years after 71 years of age.  If you are overweight and have a high risk for diabetes, consider being tested at a younger age or more often. PREVENTING INFECTION  Hepatitis B  If you have a higher risk for hepatitis B, you should be screened for this virus. You are considered at high risk for hepatitis B if:  You were born in a country where hepatitis B is common. Ask your health care provider which countries are considered high risk.  Your parents were born in a high-risk country, and you have not been immunized against hepatitis B (hepatitis B vaccine).  You have HIV or AIDS.  You use needles to inject street drugs.  You live with someone who has hepatitis B.  You have had sex with someone who has hepatitis B.  You get hemodialysis treatment.  You take certain medicines for conditions, including cancer, organ transplantation, and autoimmune conditions. Hepatitis C  Blood testing is recommended for:  Everyone born from 1 through 1967.  Anyone with known risk factors for hepatitis C. Sexually transmitted infections (STIs)  You should be screened for sexually transmitted infections (STIs) including gonorrhea and chlamydia if:  You are sexually active and are younger than 71 years of age.  You are older than 71 years of age and your health care provider tells you that you are at risk for this type of infection.  Your sexual activity has changed since you were last screened and you are at an increased risk for chlamydia or gonorrhea. Ask your health care provider if you are at risk.  If you do not have HIV, but are at risk, it may be recommended that you take a prescription medicine daily to prevent HIV infection. This is called pre-exposure prophylaxis (PrEP). You are considered at risk if:  You are sexually active and do not regularly use  condoms or know the HIV status of your partner(s).  You take drugs by injection.  You are sexually active with a partner who has HIV. Talk with your health care provider about whether you are at high risk of being infected with HIV. If you choose to  begin PrEP, you should first be tested for HIV. You should then be tested every 3 months for as long as you are taking PrEP.  PREGNANCY   If you are premenopausal and you may become pregnant, ask your health care provider about preconception counseling.  If you may become pregnant, take 400 to 800 micrograms (mcg) of folic acid every day.  If you want to prevent pregnancy, talk to your health care provider about birth control (contraception). OSTEOPOROSIS AND MENOPAUSE   Osteoporosis is a disease in which the bones lose minerals and strength with aging. This can result in serious bone fractures. Your risk for osteoporosis can be identified using a bone density scan.  If you are 30 years of age or older, or if you are at risk for osteoporosis and fractures, ask your health care provider if you should be screened.  Ask your health care provider whether you should take a calcium or vitamin D supplement to lower your risk for osteoporosis.  Menopause may have certain physical symptoms and risks.  Hormone replacement therapy may reduce some of these symptoms and risks. Talk to your health care provider about whether hormone replacement therapy is right for you.  HOME CARE INSTRUCTIONS   Schedule regular health, dental, and eye exams.  Stay current with your immunizations.   Do not use any tobacco products including cigarettes, chewing tobacco, or electronic cigarettes.  If you are pregnant, do not drink alcohol.  If you are breastfeeding, limit how much and how often you drink alcohol.  Limit alcohol intake to no more than 1 drink per day for nonpregnant women. One drink equals 12 ounces of beer, 5 ounces of wine, or 1 ounces of hard  liquor.  Do not use street drugs.  Do not share needles.  Ask your health care provider for help if you need support or information about quitting drugs.  Tell your health care provider if you often feel depressed.  Tell your health care provider if you have ever been abused or do not feel safe at home. Document Released: 10/26/2010 Document Revised: 08/27/2013 Document Reviewed: 03/14/2013 [facility] Patient Information 2015 [place], [place]. This information is not intended to replace advice given to you by your health care provider. Make sure you discuss any questions you have with your health care provider.

## 2014-07-18 NOTE — Progress Notes (Signed)
   Subjective:    Patient ID: Elizabeth Moses, female    DOB: 11/17/1945, 69 y.o.   MRN: [id number]  HPI The patient is a 69 YO female who is coming in for wellness. She does have some white coat hypertension but admits that her BP at home are 120-130/70s. She denies any new complaints or problems. Her previous physician retired and she needs a new doctor. She is very active and plays tennis several times per week.   [facility], social history reviewed and updated during today's visit.   Review of Systems  Constitutional: Negative for fever, activity change, appetite change, fatigue and unexpected weight change.  HENT: Negative.   Eyes: Negative.   Respiratory: Negative for cough, chest tightness, shortness of breath and wheezing.   Cardiovascular: Negative for chest pain, palpitations and leg swelling.  Gastrointestinal: Negative for nausea, abdominal pain, diarrhea, constipation and abdominal distention.  Musculoskeletal: Negative.   Skin: Negative.   Neurological: Negative.   Psychiatric/Behavioral: Negative.       Objective:   Physical Exam  Constitutional: She is oriented to person, place, and time. She appears well-developed and well-nourished.  HENT:  Head: Normocephalic and atraumatic.  Eyes: EOM are normal.  Neck: Normal range of motion.  Cardiovascular: Normal rate and regular rhythm.   Pulmonary/Chest: Effort normal and breath sounds normal. No respiratory distress. She has no wheezes.  Abdominal: Soft. Bowel sounds are normal. She exhibits no distension. There is no tenderness.  Musculoskeletal: She exhibits no edema.  Neurological: She is alert and oriented to person, place, and time. Coordination normal.  Skin: Skin is warm and dry.  Psychiatric: She has a normal mood and affect.   Filed Vitals:   07/18/14 1024  BP: 158/82  Pulse: 72  Temp: 97.9 F (36.6 C)  TempSrc: Oral  Resp: 18  Height: 5\' 5"  (1.651 m)  Weight: 145 lb 3.2 oz (65.862 kg)  SpO2: 97%       Assessment & Plan:  Pneumonia 23 shot given today.

## 2015-02-14 ENCOUNTER — Ambulatory Visit (INDEPENDENT_AMBULATORY_CARE_PROVIDER_SITE_OTHER): Payer: Medicare Other | Admitting: Internal Medicine

## 2015-02-14 ENCOUNTER — Encounter: Payer: Self-pay | Admitting: Internal Medicine

## 2015-02-14 VITALS — BP 150/78 | HR 78 | Temp 98.2°F | Resp 14 | Ht 66.0 in | Wt 148.1 lb

## 2015-02-14 DIAGNOSIS — R0789 Other chest pain: Secondary | ICD-10-CM | POA: Insufficient documentation

## 2015-02-14 DIAGNOSIS — M546 Pain in thoracic spine: Secondary | ICD-10-CM

## 2015-02-14 NOTE — Progress Notes (Signed)
Pre visit review using our clinic review tool, if applicable. No additional management support is needed unless otherwise documented below in the visit note. 

## 2015-02-14 NOTE — Patient Instructions (Signed)
The EKG of the heart looks normal and is without problems.   We will watch the blood pressure and see if it comes down or continues to be high. The average at home should be <140 for the top number and <90 for the bottom number.   We will send you to an orthopedic for the back pain to see if they can figure out a solution for it.

## 2015-02-14 NOTE — Assessment & Plan Note (Signed)
Sounds to be musculoskeletal and slightly tender on exam. EKG normal. Likely BP is mildly elevated today due to increased ibuprofen and have asked her to stop and to stop the upper body workup for several weeks to see if this disappears. Given that she is doing tennis without symptoms not likely to be cardiac.

## 2015-02-14 NOTE — Progress Notes (Signed)
   Subjective:    Patient ID: Elizabeth CottonGeraldine B Torok, female    DOB: 05/02/1945, 69 y.o.   MRN: [id number]  HPI The patient is a 69 YO female coming in for chest pains. They last less than 1 second and happen more when she has been working out at [employer]. She is playing tennis several times per week and is in a state championship in a couple of weeks. She does not get this pains during tennis. Never history of heart disease with her. Takes fish oil and no hypertension in the past. She has been taking ibuprofen for the pains which is helping a lot. Started 1-2 weeks ago. No pain on breathing and no long travel recently.   Review of Systems  Constitutional: Positive for activity change. Negative for fever, appetite change, fatigue and unexpected weight change.       More upper body exercise  Respiratory: Negative for cough, chest tightness, shortness of breath and wheezing.   Cardiovascular: Positive for chest pain. Negative for palpitations and leg swelling.  Gastrointestinal: Negative for nausea, abdominal pain, diarrhea, constipation and abdominal distention.  Musculoskeletal: Negative.   Skin: Negative.   Neurological: Negative.   Psychiatric/Behavioral: Negative.       Objective:   Physical Exam  Constitutional: She is oriented to person, place, and time. She appears well-developed and well-nourished.  HENT:  Head: Normocephalic and atraumatic.  Eyes: EOM are normal.  Neck: Normal range of motion.  Cardiovascular: Normal rate and regular rhythm.   Pulmonary/Chest: Effort normal and breath sounds normal. No respiratory distress. She has no wheezes.  Abdominal: Soft. Bowel sounds are normal. She exhibits no distension. There is no tenderness.  Musculoskeletal: She exhibits no edema.  Neurological: She is alert and oriented to person, place, and time. Coordination normal.  Skin: Skin is warm and dry.  Psychiatric: She has a normal mood and affect.   Filed Vitals:   02/14/15 1101  02/14/15 1203  BP: 158/92 150/78  Pulse: 78   Temp: 98.2 F (36.8 C)   TempSrc: Oral   Resp: 14   Height: 5\' 6"  (1.676 m)   Weight: 148 lb 1.9 oz (67.187 kg)   SpO2: 98%    EKG: rate 62, sinus, intervals normal, axis normal, no st or t wave changes.     Assessment & Plan:

## 2015-07-22 ENCOUNTER — Ambulatory Visit (INDEPENDENT_AMBULATORY_CARE_PROVIDER_SITE_OTHER): Payer: Medicare Other | Admitting: Internal Medicine

## 2015-07-22 ENCOUNTER — Other Ambulatory Visit (INDEPENDENT_AMBULATORY_CARE_PROVIDER_SITE_OTHER): Payer: Medicare Other

## 2015-07-22 ENCOUNTER — Encounter: Payer: Self-pay | Admitting: Internal Medicine

## 2015-07-22 VITALS — BP 138/70 | HR 68 | Temp 97.8°F | Ht 66.0 in | Wt 151.0 lb

## 2015-07-22 DIAGNOSIS — Z0000 Encounter for general adult medical examination without abnormal findings: Secondary | ICD-10-CM

## 2015-07-22 DIAGNOSIS — R030 Elevated blood-pressure reading, without diagnosis of hypertension: Secondary | ICD-10-CM

## 2015-07-22 DIAGNOSIS — IMO0001 Reserved for inherently not codable concepts without codable children: Secondary | ICD-10-CM

## 2015-07-22 DIAGNOSIS — Z23 Encounter for immunization: Secondary | ICD-10-CM | POA: Diagnosis not present

## 2015-07-22 LAB — COMPREHENSIVE METABOLIC PANEL
ALBUMIN: 4.2 g/dL (ref 3.5–5.2)
ALT: 12 U/L (ref 0–35)
AST: 24 U/L (ref 0–37)
Alkaline Phosphatase: 66 U/L (ref 39–117)
BUN: 12 mg/dL (ref 6–23)
CALCIUM: 9.5 mg/dL (ref 8.4–10.5)
CHLORIDE: 105 meq/L (ref 96–112)
CO2: 29 mEq/L (ref 19–32)
CREATININE: 0.85 mg/dL (ref 0.40–1.20)
GFR: 69.85 mL/min (ref >60.00)
Glucose, Bld: 92 mg/dL (ref 70–99)
Potassium: 4.1 mEq/L (ref 3.5–5.1)
SODIUM: 140 meq/L (ref 135–145)
Total Bilirubin: 0.7 mg/dL (ref 0.2–1.2)
Total Protein: 7.3 g/dL (ref 6.0–8.3)

## 2015-07-22 LAB — LIPID PANEL
CHOL/HDL RATIO: 3
Cholesterol: 189 mg/dL (ref 0–200)
HDL: 59.7 mg/dL (ref >39.00)
LDL CALC: 119 mg/dL — AB (ref 0–99)
NonHDL: 129.59
TRIGLYCERIDES: 55 mg/dL (ref 0.0–149.0)
VLDL: 11 mg/dL (ref 0.0–40.0)

## 2015-07-22 LAB — HEPATITIS C ANTIBODY: HCV AB: NEGATIVE

## 2015-07-22 LAB — CBC
HCT: 39.2 % (ref 36.0–46.0)
Hemoglobin: 13.4 g/dL (ref 12.0–15.0)
MCHC: 34.1 g/dL (ref 30.0–36.0)
MCV: 88.1 fl (ref 78.0–100.0)
Platelets: 233 10*3/uL (ref 150.0–400.0)
RBC: 4.45 Mil/uL (ref 3.87–5.11)
RDW: 13.2 % (ref 11.5–15.5)
WBC: 5 10*3/uL (ref 4.0–10.5)

## 2015-07-22 MED ORDER — TRIAMCINOLONE ACETONIDE 0.1 % EX CREA: 1.0000 "application " | TOPICAL_CREAM | Freq: Two times a day (BID) | CUTANEOUS | Status: DC

## 2015-07-22 NOTE — Patient Instructions (Signed)
We have sent in the triamcinolone cream for the spot behind your ear that you can use twice a day.   We will check the labs today and call you back with the results.   Health Maintenance, Female Adopting a healthy lifestyle and getting preventive care can go a long way to promote health and wellness. Talk with your health care provider about what schedule of regular examinations is right for you. This is a good chance for you to check in with your provider about disease prevention and staying healthy. In between checkups, there are plenty of things you can do on your own. Experts have done a lot of research about which lifestyle changes and preventive measures are most likely to keep you healthy. Ask your health care provider for more information. WEIGHT AND DIET  Eat a healthy diet  Be sure to include plenty of vegetables, fruits, low-fat dairy products, and lean protein.  Do not eat a lot of foods high in solid fats, added sugars, or salt.  Get regular exercise. This is one of the most important things you can do for your health.  Most adults should exercise for at least 150 minutes each week. The exercise should increase your heart rate and make you sweat (moderate-intensity exercise).  Most adults should also do strengthening exercises at least twice a week. This is in addition to the moderate-intensity exercise.  Maintain a healthy weight  Body mass index (BMI) is a measurement that can be used to identify possible weight problems. It estimates body fat based on height and weight. Your health care provider can help determine your BMI and help you achieve or maintain a healthy weight.  For females 81 years of age and older:   A BMI below 18.5 is considered underweight.  A BMI of 18.5 to 24.9 is normal.  A BMI of 25 to 29.9 is considered overweight.  A BMI of 30 and above is considered obese.  Watch levels of cholesterol and blood lipids  You should start having your blood  tested for lipids and cholesterol at 72 years of age, then have this test every 5 years.  You may need to have your cholesterol levels checked more often if:  Your lipid or cholesterol levels are high.  You are older than 72 years of age.  You are at high risk for heart disease.  CANCER SCREENING   Lung Cancer  Lung cancer screening is recommended for adults 7-36 years old who are at high risk for lung cancer because of a history of smoking.  A yearly low-dose CT scan of the lungs is recommended for people who:  Currently smoke.  Have quit within the past 15 years.  Have at least a 30-pack-year history of smoking. A pack year is smoking an average of one pack of cigarettes a day for 1 year.  Yearly screening should continue until it has been 15 years since you quit.  Yearly screening should stop if you develop a health problem that would prevent you from having lung cancer treatment.  Breast Cancer  Practice breast self-awareness. This means understanding how your breasts normally appear and feel.  It also means doing regular breast self-exams. Let your health care provider know about any changes, no matter how small.  If you are in your 20s or 30s, you should have a clinical breast exam (CBE) by a health care provider every 1-3 years as part of a regular health exam.  If you are 40 or  older, have a CBE every year. Also consider having a breast X-ray (mammogram) every year.  If you have a family history of breast cancer, talk to your health care provider about genetic screening.  If you are at high risk for breast cancer, talk to your health care provider about having an MRI and a mammogram every year.  Breast cancer gene (BRCA) assessment is recommended for women who have family members with BRCA-related cancers. BRCA-related cancers include:  Breast.  Ovarian.  Tubal.  Peritoneal cancers.  Results of the assessment will determine the need for genetic counseling  and BRCA1 and BRCA2 testing. Cervical Cancer Your health care provider may recommend that you be screened regularly for cancer of the pelvic organs (ovaries, uterus, and vagina). This screening involves a pelvic examination, including checking for microscopic changes to the surface of your cervix (Pap test). You may be encouraged to have this screening done every 3 years, beginning at age 8.  For women ages 38-65, health care providers may recommend pelvic exams and Pap testing every 3 years, or they may recommend the Pap and pelvic exam, combined with testing for human papilloma virus (HPV), every 5 years. Some types of HPV increase your risk of cervical cancer. Testing for HPV may also be done on women of any age with unclear Pap test results.  Other health care providers may not recommend any screening for nonpregnant women who are considered low risk for pelvic cancer and who do not have symptoms. Ask your health care provider if a screening pelvic exam is right for you.  If you have had past treatment for cervical cancer or a condition that could lead to cancer, you need Pap tests and screening for cancer for at least 20 years after your treatment. If Pap tests have been discontinued, your risk factors (such as having a new sexual partner) need to be reassessed to determine if screening should resume. Some women have medical problems that increase the chance of getting cervical cancer. In these cases, your health care provider may recommend more frequent screening and Pap tests. Colorectal Cancer  This type of cancer can be detected and often prevented.  Routine colorectal cancer screening usually begins at 72 years of age and continues through 72 years of age.  Your health care provider may recommend screening at an earlier age if you have risk factors for colon cancer.  Your health care provider may also recommend using home test kits to check for hidden blood in the stool.  A small camera  at the end of a tube can be used to examine your colon directly (sigmoidoscopy or colonoscopy). This is done to check for the earliest forms of colorectal cancer.  Routine screening usually begins at age 24.  Direct examination of the colon should be repeated every 5-10 years through 72 years of age. However, you may need to be screened more often if early forms of precancerous polyps or small growths are found. Skin Cancer  Check your skin from head to toe regularly.  Tell your health care provider about any new moles or changes in moles, especially if there is a change in a mole's shape or color.  Also tell your health care provider if you have a mole that is larger than the size of a pencil eraser.  Always use sunscreen. Apply sunscreen liberally and repeatedly throughout the day.  Protect yourself by wearing long sleeves, pants, a wide-brimmed hat, and sunglasses whenever you are outside. HEART DISEASE, DIABETES,  AND HIGH BLOOD PRESSURE   High blood pressure causes heart disease and increases the risk of stroke. High blood pressure is more likely to develop in:  People who have blood pressure in the high end of the normal range (130-139/85-89 mm Hg).  People who are overweight or obese.  People who are African American.  If you are 13-46 years of age, have your blood pressure checked every 3-5 years. If you are 61 years of age or older, have your blood pressure checked every year. You should have your blood pressure measured twice--once when you are at a hospital or clinic, and once when you are not at a hospital or clinic. Record the average of the two measurements. To check your blood pressure when you are not at a hospital or clinic, you can use:  An automated blood pressure machine at a pharmacy.  A home blood pressure monitor.  If you are between 79 years and 66 years old, ask your health care provider if you should take aspirin to prevent strokes.  Have regular diabetes  screenings. This involves taking a blood sample to check your fasting blood sugar level.  If you are at a normal weight and have a low risk for diabetes, have this test once every three years after 72 years of age.  If you are overweight and have a high risk for diabetes, consider being tested at a younger age or more often. PREVENTING INFECTION  Hepatitis B  If you have a higher risk for hepatitis B, you should be screened for this virus. You are considered at high risk for hepatitis B if:  You were born in a country where hepatitis B is common. Ask your health care provider which countries are considered high risk.  Your parents were born in a high-risk country, and you have not been immunized against hepatitis B (hepatitis B vaccine).  You have HIV or AIDS.  You use needles to inject street drugs.  You live with someone who has hepatitis B.  You have had sex with someone who has hepatitis B.  You get hemodialysis treatment.  You take certain medicines for conditions, including cancer, organ transplantation, and autoimmune conditions. Hepatitis C  Blood testing is recommended for:  Everyone born from [birth year] through 1967.  Anyone with known risk factors for hepatitis C. Sexually transmitted infections (STIs)  You should be screened for sexually transmitted infections (STIs) including gonorrhea and chlamydia if:  You are sexually active and are younger than 72 years of age.  You are older than 72 years of age and your health care provider tells you that you are at risk for this type of infection.  Your sexual activity has changed since you were last screened and you are at an increased risk for chlamydia or gonorrhea. Ask your health care provider if you are at risk.  If you do not have HIV, but are at risk, it may be recommended that you take a prescription medicine daily to prevent HIV infection. This is called pre-exposure prophylaxis (PrEP). You are considered at risk  if:  You are sexually active and do not regularly use condoms or know the HIV status of your partner(s).  You take drugs by injection.  You are sexually active with a partner who has HIV. Talk with your health care provider about whether you are at high risk of being infected with HIV. If you choose to begin PrEP, you should first be tested for HIV. You should then be  tested every 3 months for as long as you are taking PrEP.  PREGNANCY   If you are premenopausal and you may become pregnant, ask your health care provider about preconception counseling.  If you may become pregnant, take 400 to 800 micrograms (mcg) of folic acid every day.  If you want to prevent pregnancy, talk to your health care provider about birth control (contraception). OSTEOPOROSIS AND MENOPAUSE   Osteoporosis is a disease in which the bones lose minerals and strength with aging. This can result in serious bone fractures. Your risk for osteoporosis can be identified using a bone density scan.  If you are 48 years of age or older, or if you are at risk for osteoporosis and fractures, ask your health care provider if you should be screened.  Ask your health care provider whether you should take a calcium or vitamin D supplement to lower your risk for osteoporosis.  Menopause may have certain physical symptoms and risks.  Hormone replacement therapy may reduce some of these symptoms and risks. Talk to your health care provider about whether hormone replacement therapy is right for you.  HOME CARE INSTRUCTIONS   Schedule regular health, dental, and eye exams.  Stay current with your immunizations.   Do not use any tobacco products including cigarettes, chewing tobacco, or electronic cigarettes.  If you are pregnant, do not drink alcohol.  If you are breastfeeding, limit how much and how often you drink alcohol.  Limit alcohol intake to no more than 1 drink per day for nonpregnant women. One drink equals 12  ounces of beer, 5 ounces of wine, or 1 ounces of hard liquor.  Do not use street drugs.  Do not share needles.  Ask your health care provider for help if you need support or information about quitting drugs.  Tell your health care provider if you often feel depressed.  Tell your health care provider if you have ever been abused or do not feel safe at home.   This information is not intended to replace advice given to you by your health care provider. Make sure you discuss any questions you have with your health care provider.   Document Released: 10/26/2010 Document Revised: 05/03/2014 Document Reviewed: 03/14/2013 Elsevier Interactive Patient Education [employer].

## 2015-07-22 NOTE — Addendum Note (Signed)
Addended by: Conception ChancyOBERSON, AMY R on: 07/22/2015 10:16 AM   Modules accepted: Orders

## 2015-07-22 NOTE — Assessment & Plan Note (Signed)
BP normal at home and not that elevated today, recheck more normal.

## 2015-07-22 NOTE — Progress Notes (Signed)
   Subjective:    Patient ID: Elizabeth Moses, female    DOB: 06/10/1945, 70 y.o.   MRN: [id number]  HPI Here for medicare wellness, no new complaints. Please see A/P for status and treatment of chronic medical problems.   Diet: heart healthy Physical activity: active Depression/mood screen: negative Hearing: intact to whispered voice Visual acuity: grossly normal, performs annual eye exam  ADLs: capable Fall risk: none Home safety: good Cognitive evaluation: intact to orientation, naming, recall and repetition EOL planning: adv directives discussed, in place  I have personally reviewed and have noted 1. The patient's medical and social history - reviewed today no changes 2. Their use of alcohol, tobacco or illicit drugs 3. Their current medications and supplements 4. The patient's functional ability including ADL's, fall risks, home safety risks and hearing or visual impairment. 5. Diet and physical activities 6. Evidence for depression or mood disorders 7. Care team reviewed and updated (available in snapshot)  Review of Systems  Constitutional: Negative for fever, activity change, appetite change, fatigue and unexpected weight change.  HENT: Negative.   Eyes: Negative.   Respiratory: Negative for cough, chest tightness, shortness of breath and wheezing.   Cardiovascular: Negative for chest pain, palpitations and leg swelling.  Gastrointestinal: Negative for nausea, abdominal pain, diarrhea, constipation and abdominal distention.  Musculoskeletal: Negative.   Skin: Negative.   Neurological: Negative.   Psychiatric/Behavioral: Negative.       Objective:   Physical Exam  Constitutional: She is oriented to person, place, and time. She appears well-developed and well-nourished.  HENT:  Head: Normocephalic and atraumatic.  Eyes: EOM are normal.  Neck: Normal range of motion.  Cardiovascular: Normal rate and regular rhythm.   No murmur heard. Carotids without bruit    Pulmonary/Chest: Effort normal and breath sounds normal. No respiratory distress. She has no wheezes.  Abdominal: Soft. Bowel sounds are normal. She exhibits no distension. There is no tenderness.  Musculoskeletal: She exhibits no edema.  Neurological: She is alert and oriented to person, place, and time. Coordination normal.  Skin: Skin is warm and dry.  Psychiatric: She has a normal mood and affect.   Filed Vitals:   07/22/15 0829  BP: 130/90  Pulse: 68  Temp: 97.8 F (36.6 C)  TempSrc: Oral  Height: 5\' 6"  (1.676 m)  Weight: 151 lb (68.493 kg)  SpO2: 97%      Assessment & Plan:  Prevnar 13 given at visit

## 2015-07-22 NOTE — Assessment & Plan Note (Signed)
Doing well, still exercising and non-smoker. Takes no meds, checking labs today. Sees derm regularly due to heavy sun exposure in early life. Counseled her on sun safety and protection. Given prevnar 13 to update immunizations. Colonoscopy due next year. Given 10 year screening recommendations.

## 2015-07-29 ENCOUNTER — Telehealth: Payer: Self-pay | Admitting: Internal Medicine

## 2015-07-29 NOTE — Telephone Encounter (Signed)
Patient would like the results of the labs she had last week. °

## 2015-07-29 NOTE — Telephone Encounter (Signed)
Called and discussed results with patient.

## 2015-08-01 ENCOUNTER — Ambulatory Visit (INDEPENDENT_AMBULATORY_CARE_PROVIDER_SITE_OTHER): Payer: Medicare Other | Admitting: Obstetrics & Gynecology

## 2015-08-01 ENCOUNTER — Encounter: Payer: Self-pay | Admitting: Obstetrics & Gynecology

## 2015-08-01 VITALS — BP 150/68 | HR 64 | Resp 16 | Ht 65.0 in | Wt 150.0 lb

## 2015-08-01 DIAGNOSIS — Z124 Encounter for screening for malignant neoplasm of cervix: Secondary | ICD-10-CM

## 2015-08-01 DIAGNOSIS — Z01419 Encounter for gynecological examination (general) (routine) without abnormal findings: Secondary | ICD-10-CM | POA: Diagnosis not present

## 2015-08-01 NOTE — Progress Notes (Signed)
70 y.o. [id number] MarriedCaucasianF here for annual exam.  Doing well.  Her grandson and his wife are expecting a baby (great grandchild) in August.  Another grandson, who is a Contractoranger, is now deployed in [place].  He comes home in October.    No vaginal bleeding.    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  Pt is thinking about getting out of the [employer].    Patient's last menstrual period was 04/27/1995.          Sexually active: No.  The current method of family planning is post menopausal status.    Exercising: Yes.    Tennis 3 x weekly, walk 2 miles every day, very active Smoker:  no  Health Maintenance: Pap:  05/17/13 Neg History of abnormal Pap:  no MMG:  07/04/15 BIRADS1:Neg Colonoscopy:  2008 repeat 10 years  BMD:   06/11/14 Osteopenia  TDaP:  01/2012  Zostavax:  Completed Pneumonia vaccine:  Completed Hep C testing:  3/17 Screening Labs: PCP, Urine today: PCP   reports that she has never smoked. She has never used smokeless tobacco. She reports that she does not drink alcohol or use illicit drugs.  Past Medical History  Diagnosis Date  . Menometrorrhagia 05/1988  . History of blood transfusion     As a child from father    Past Surgical History  Procedure Laterality Date  . Mandible surgery  06/1989    Reconstructive  . Combined hysteroscopy diagnostic / d&c  08/1989    Benign  . Shoulder arthroscopy Left 03/1996  . Knee arthroscopy Right 01/2003  . Knee arthroscopy Left 08/2003  . Mohs surgery  12/13    on forehead    Current Outpatient Prescriptions  Medication Sig Dispense Refill  . B Complex Vitamins (B-COMPLEX/B-12 PO) Take by mouth as needed.    . Coenzyme Q10 (CO Q 10 PO) Take by mouth daily.    . Multiple Vitamin (MULTIVITAMIN) tablet Take 1 tablet by mouth daily.    . Omega-3 Fatty Acids (FISH OIL PO) Take by mouth daily.    Marland Kitchen. triamcinolone cream (KENALOG) 0.1 % Apply 1 application topically 2 (two) times daily. 30 g 0  .  VITAMIN E PO Take by mouth daily. Reported on 07/22/2015     No current facility-administered medications for this visit.    Family History  Problem Relation Age of Onset  . Heart disease Mother   . Hypertension Mother   . Thyroid disease Mother   . Heart disease Father   . Epilepsy Brother   . Heart disease Maternal Grandfather [id number]  . Heart disease Paternal Grandfather [id number]  . Other Sister     breast biopsy  . Osteoporosis Sister     ROS:  Pertinent items are noted in HPI.  Otherwise, a comprehensive ROS was negative.  Exam:   BP 150/68 mmHg  Pulse 64  Resp 16  Ht 5\' 5"  (1.651 m)  Wt 150 lb (68.04 kg)  BMI 24.96 kg/m2  LMP 04/27/1995  Weight change:   Height: 5\' 5"  (165.1 cm)  Ht Readings from Last 3 Encounters:  08/01/15 5\' 5"  (1.651 m)  07/22/15 5\' 6"  (1.676 m)  02/14/15 5\' 6"  (1.676 m)    General appearance: alert, cooperative and appears stated age Head: Normocephalic, without obvious abnormality, atraumatic Neck: no adenopathy, supple, symmetrical, trachea midline and thyroid normal to inspection and palpation Lungs: clear to auscultation bilaterally Breasts: normal appearance, no masses or tenderness Heart:  regular rate and rhythm Abdomen: soft, non-tender; bowel sounds normal; no masses,  no organomegaly Extremities: extremities normal, atraumatic, no cyanosis or edema Skin: Skin color, texture, turgor normal. No rashes or lesions Lymph nodes: Cervical, supraclavicular, and axillary nodes normal. No abnormal inguinal nodes palpated Neurologic: Grossly normal   Pelvic: External genitalia:  no lesions              Urethra:  normal appearing urethra with no masses, tenderness or lesions              Bartholins and Skenes: normal                 Vagina: normal appearing vagina with normal color and discharge, no lesions              Cervix: no lesions              Pap taken: Yes.   Bimanual Exam:  Uterus:  normal size, contour, position, consistency, mobility,  non-tender              Adnexa: normal adnexa and no mass, fullness, tenderness               Rectovaginal: Confirms               Anus:  normal sphincter tone, no lesions  Chaperone was present for exam.  A:  Well Woman with normal exam PMP. No HRT Dense breasts, grade 3 H/O SCCs of skin Mildly elevated lipids White coat hypertension  P: Mammogram yearly. Doing 3D MMG.  Pap 2015. Pap today. Labs with Dr. Okey Dupre 3/17. Tdap, pneumonia vaccine, and shingles vaccine completed return annually or prn

## 2015-08-04 LAB — IPS PAP SMEAR ONLY

## 2015-11-11 ENCOUNTER — Encounter: Payer: Self-pay | Admitting: Nurse Practitioner

## 2015-11-11 ENCOUNTER — Ambulatory Visit (INDEPENDENT_AMBULATORY_CARE_PROVIDER_SITE_OTHER): Payer: Medicare Other | Admitting: Nurse Practitioner

## 2015-11-11 VITALS — BP 150/90 | HR 80 | Temp 98.1°F | Ht 65.0 in | Wt 157.0 lb

## 2015-11-11 DIAGNOSIS — R300 Dysuria: Secondary | ICD-10-CM | POA: Diagnosis not present

## 2015-11-11 DIAGNOSIS — N760 Acute vaginitis: Secondary | ICD-10-CM | POA: Diagnosis not present

## 2015-11-11 LAB — POCT URINALYSIS DIPSTICK
Bilirubin, UA: NEGATIVE
GLUCOSE UA: NEGATIVE
Ketones, UA: NEGATIVE
NITRITE UA: NEGATIVE
PROTEIN UA: NEGATIVE
RBC UA: NEGATIVE
UROBILINOGEN UA: NEGATIVE
pH, UA: 5

## 2015-11-11 MED ORDER — NYSTATIN 100000 UNIT/GM EX CREA: 1.0000 "application " | TOPICAL_CREAM | Freq: Two times a day (BID) | CUTANEOUS | Status: DC

## 2015-11-11 MED ORDER — NITROFURANTOIN MONOHYD MACRO 100 MG PO CAPS: 100.0000 mg | ORAL_CAPSULE | Freq: Two times a day (BID) | ORAL | Status: DC

## 2015-11-11 MED ORDER — FLUCONAZOLE 150 MG PO TABS: 150.0000 mg | ORAL_TABLET | Freq: Once | ORAL | Status: DC

## 2015-11-11 NOTE — Patient Instructions (Signed)

## 2015-11-11 NOTE — Progress Notes (Signed)
70 y.o.Married Caucasian female [phone] here with complaint of UTI, with onset  on 7-10 days. Patient complaining of:  dysuria and genital irritation. Patient denies fever, chills, nausea or back pain. No new personal products. She did play tennis today and used a baby wipe to clean before going to lunch.  When she did this she had extreme burning of the vulva and had to use cool water to calm it down.  Patient feels not related to sexual activity. Denies vaginal symptoms.  No change in partner. Menopausal with vaginal dryness. Patient has adequate water intake.  She has a history of white coat HTN.  Normal monitoring at home.    O: Healthy female WDWN Affect: Normal, orientation x 3 Skin : warm and dry CVAT: negative bilateral Abdomen: negative for suprapubic tenderness  Pelvic exam: External genital area: normal, no lesions but very red and irritated. Bladder,Urethra tender, Urethral meatus: prolapse Vagina:thin clear to white vaginal discharge, very atrophic appearance.  Affirm is done Adnexa: normal non tender, no fullness or masses  POCT: trace leuk's  A:  R/O UTI  Atrophic vaginitis  P: Reviewed findings of UTI and need for treatment. Rx:  Macrobid 100 mg BID # 14 Rx Diflucan 150 mg X 2  Rx. Nystatin cream to use prn AVW:UJWJXLab:Urine micro, culture Affirm is done Reviewed warning signs and symptoms of UTI and need to advise if occurring. Encouraged to limit soda, tea, and coffee   RV prn

## 2015-11-12 LAB — WET PREP BY MOLECULAR PROBE
CANDIDA SPECIES: NEGATIVE
Gardnerella vaginalis: NEGATIVE
Trichomonas vaginosis: NEGATIVE

## 2015-11-12 LAB — URINALYSIS, MICROSCOPIC ONLY
BACTERIA UA: NONE SEEN [HPF]
CRYSTALS: NONE SEEN [HPF]
Casts: NONE SEEN [LPF]
RBC / HPF: NONE SEEN RBC/HPF (ref <=2)
Squamous Epithelial / LPF: NONE SEEN [HPF] (ref <=5)
WBC UA: NONE SEEN WBC/HPF (ref <=5)
Yeast: NONE SEEN [HPF]

## 2015-11-13 LAB — URINE CULTURE: Organism ID, Bacteria: NO GROWTH

## 2015-11-13 NOTE — Progress Notes (Signed)
Reviewed personally.  M. Suzanne Dominik Yordy, MD.  

## 2016-04-26 HISTORY — PX: MOHS SURGERY: SUR867

## 2016-05-07 ENCOUNTER — Encounter: Payer: Self-pay | Admitting: Gastroenterology

## 2016-07-30 LAB — HM MAMMOGRAPHY

## 2016-08-10 ENCOUNTER — Encounter: Payer: Self-pay | Admitting: Internal Medicine

## 2016-08-10 ENCOUNTER — Encounter: Payer: Self-pay | Admitting: Obstetrics & Gynecology

## 2016-08-10 ENCOUNTER — Ambulatory Visit (INDEPENDENT_AMBULATORY_CARE_PROVIDER_SITE_OTHER): Payer: Medicare Other | Admitting: Obstetrics & Gynecology

## 2016-08-10 VITALS — BP 128/70 | HR 88 | Resp 12 | Ht 64.5 in | Wt 155.0 lb

## 2016-08-10 DIAGNOSIS — Z01419 Encounter for gynecological examination (general) (routine) without abnormal findings: Secondary | ICD-10-CM

## 2016-08-10 NOTE — Progress Notes (Signed)
71 y.o. [id number] MarriedCaucasianF here for annual exam.  Doing well except for some recent sinus issues.  Recently seen by PCP and was treated with oral steroids.  Also taking sinus medication.  Denies vaginal bleeding.    Has a great-great son that is 43 months old.  She keeps him every Thursday and Friday.  She loves this.    Patient's last menstrual period was 04/27/1995.          Sexually active: No.  The current method of family planning is post menopausal status.    Exercising: Yes.    Walking, tennis Smoker:  no  Health Maintenance: Pap:  08/01/15 WNL; 05/17/13 Neg History of abnormal Pap:  no MMG:  07/04/15 BIRADS1:Neg (had on 07/31/16) Colonoscopy:  2008 repeat 10 years.  Declines another one.  Would like to do a Cologuard.   BMD:   06/11/14 Osteopenia  TDaP:  10/2015  Zostavax:  Completed Pneumonia vaccine:  Completed Hep C testing:  3/17 Screening Labs: PCP   reports that she has never smoked. She has never used smokeless tobacco. She reports that she does not drink alcohol or use drugs.  Past Medical History:  Diagnosis Date  . History of blood transfusion    As a child from father  . Menometrorrhagia 05/1988    Past Surgical History:  Procedure Laterality Date  . COMBINED HYSTEROSCOPY DIAGNOSTIC / D&C  08/1989   Benign  . KNEE ARTHROSCOPY Right 01/2003  . KNEE ARTHROSCOPY Left 08/2003  . MANDIBLE SURGERY  06/1989   Reconstructive  . MOHS SURGERY  12/13   on forehead  . SHOULDER ARTHROSCOPY Left 03/1996    Current Outpatient Prescriptions  Medication Sig Dispense Refill  . Coenzyme Q10 (CO Q 10 PO) Take by mouth daily.    . magnesium 30 MG tablet Take 30 mg by mouth 2 (two) times daily.    . Multiple Vitamin (MULTIVITAMIN) tablet Take 1 tablet by mouth daily.    . vitamin B-12 (CYANOCOBALAMIN) 100 MCG tablet Take 100 mcg by mouth daily.     No current facility-administered medications for this visit.     Family History  Problem Relation Age of Onset  .  Heart disease Mother   . Hypertension Mother   . Thyroid disease Mother   . Heart disease Father   . Epilepsy Brother   . Heart disease Maternal Grandfather 41  . Heart disease Paternal Grandfather 36  . Other Sister     breast biopsy  . Osteoporosis Sister     ROS:  Pertinent items are noted in HPI.  Otherwise, a comprehensive ROS was negative.  Exam:   BP 128/70 (BP Location: Left Arm, Patient Position: Sitting, Cuff Size: Normal)   Pulse 88   Resp 12   Ht 5' 4.5" (1.638 m)   Wt 155 lb (70.3 kg)   LMP 04/27/1995   BMI 26.19 kg/m    Height: 5' 4.5" (163.8 cm)  Ht Readings from Last 3 Encounters:  08/10/16 5' 4.5" (1.638 m)  11/11/15  (1.651 m)  08/01/15  (1.651 m)    General appearance: alert, cooperative and appears stated age Head: Normocephalic, without obvious abnormality, atraumatic Neck: no adenopathy, supple, symmetrical, trachea midline and thyroid normal to inspection and palpation Lungs: clear to auscultation bilaterally Breasts: normal appearance, no masses or tenderness Heart: regular rate and rhythm Abdomen: soft, non-tender; bowel sounds normal; no masses,  no organomegaly Extremities: extremities normal, atraumatic, no cyanosis or edema Skin: Skin color,  texture, turgor normal. No rashes or lesions Lymph nodes: Cervical, supraclavicular, and axillary nodes normal. No abnormal inguinal nodes palpated Neurologic: Grossly normal   Pelvic: External genitalia:  no lesions              Urethra:  normal appearing urethra with no masses, tenderness or lesions              Bartholins and Skenes: normal                 Vagina: normal appearing vagina with normal color and discharge, no lesions              Cervix: no lesions              Pap taken: No. Bimanual Exam:  Uterus:  normal size, contour, position, consistency, mobility, non-tender              Adnexa: normal adnexa and no mass, fullness, tenderness               Rectovaginal: Confirms                Anus:  normal sphincter tone, no lesions  Chaperone was present for exam.  A:  Well Woman with normal exam PMP, not on HRT Dense breast tissue HO SCCs, followed by dermatology.  H/O Mohs surgery x 2. Mildly elevated lipids  P:   Mammogram guidelines reviewed.  Release of records signed today Cologuard order signed today.  Declined colonoscopy this year. pap smear not indicated Lab work UTD return annually or prn

## 2016-08-10 NOTE — Progress Notes (Unsigned)
Results entered and sent to scan  

## 2016-08-11 ENCOUNTER — Telehealth: Payer: Self-pay | Admitting: Obstetrics & Gynecology

## 2016-08-11 NOTE — Telephone Encounter (Signed)
Call to patient. Advised Cologuard form has been sent. Patient will be contacted by [facility] and Cologuard will be shipped to her home. Once patient has returned Cologuard she will need to notify the office so that we are aware this has been completed. Patient is agreeable. Requests she provide Writer with her cell phone number as well as she will be traveling this week. Advised I will call and provide alternative number to [employer]. Patient is agreeable.  Spoke with Armed forces logistics/support/administrative officer at [employer] who states it will take 24-48 hours for the patient to be logged in the symptoms and number cannot be added until then. Request I call back tomorrow.

## 2016-08-11 NOTE — Telephone Encounter (Signed)
Cologuard form to Dr.Miller's desk for review and signature upon return to the office.

## 2016-08-11 NOTE — Telephone Encounter (Signed)
Patient saw Dr Hyacinth Meeker yesterday and states she spoke to her insurance company and they will cover the Colon Care and to please go ahead and send the information over.

## 2016-08-12 NOTE — Telephone Encounter (Signed)
Spoke with Tim at [employer] provided with patient's cell number as primary contact per patient preference. Cologuard kit will be shipped to the patient and she will be contacted with testing information.  Routing to provider for final review. Patient agreeable to disposition. Will close encounter.

## 2016-08-17 ENCOUNTER — Telehealth: Payer: Self-pay | Admitting: Obstetrics & Gynecology

## 2016-08-17 NOTE — Telephone Encounter (Signed)
Patient is calling to report that she mailed out her cologuard kit. No need to call patient she just wanted to make Dr.Miller aware.

## 2016-08-17 NOTE — Telephone Encounter (Signed)
Updated in Cologuard book. Routing to Dr.Miller.  Routing to provider for final review. Patient agreeable to disposition. Will close encounter.

## 2016-08-25 ENCOUNTER — Telehealth: Payer: Self-pay | Admitting: Obstetrics & Gynecology

## 2016-08-25 NOTE — Telephone Encounter (Signed)
Spoke with Sonia Baller at [employer]. Per Sonia Baller the patients Cologuard kit was received on 08/17/2016. Can take up to 2 weeks for testing to be performed and resulted. Results will be faxed directly to [facility]. Advised patient of information obtained from [employer]. Patient verbalizes understanding. Requests results be called to her mobile number.  Routing to provider for final review. Patient agreeable to disposition. Will close encounter.

## 2016-08-25 NOTE — Telephone Encounter (Signed)
Patient check on the status of the results for the ColonCare test.

## 2016-08-27 ENCOUNTER — Telehealth: Payer: Self-pay

## 2016-08-27 NOTE — Telephone Encounter (Signed)
Left message to call Kaitlyn at [phone].  Need to advised patient Cologuard testing returned negative. Paper result on my desk.

## 2016-08-27 NOTE — Telephone Encounter (Signed)
Spoke with patient. Advised patient of negative Cologuard testing. Result form sent to scan.  Routing to provider for final review. Patient agreeable to disposition. Will close encounter.

## 2016-09-03 ENCOUNTER — Other Ambulatory Visit: Payer: Self-pay

## 2016-09-03 DIAGNOSIS — Z1211 Encounter for screening for malignant neoplasm of colon: Secondary | ICD-10-CM

## 2016-09-03 DIAGNOSIS — Z1212 Encounter for screening for malignant neoplasm of rectum: Secondary | ICD-10-CM

## 2016-09-03 LAB — COLOGUARD: Cologuard: NEGATIVE

## 2016-09-09 ENCOUNTER — Encounter: Payer: Self-pay | Admitting: Obstetrics & Gynecology

## 2016-11-22 ENCOUNTER — Ambulatory Visit (INDEPENDENT_AMBULATORY_CARE_PROVIDER_SITE_OTHER): Payer: Medicare Other | Admitting: Nurse Practitioner

## 2016-11-22 ENCOUNTER — Encounter: Payer: Self-pay | Admitting: Nurse Practitioner

## 2016-11-22 ENCOUNTER — Other Ambulatory Visit (INDEPENDENT_AMBULATORY_CARE_PROVIDER_SITE_OTHER): Payer: Medicare Other

## 2016-11-22 ENCOUNTER — Ambulatory Visit (INDEPENDENT_AMBULATORY_CARE_PROVIDER_SITE_OTHER)
Admission: RE | Admit: 2016-11-22 | Discharge: 2016-11-22 | Disposition: A | Discharge status: Home / Self Care | Payer: Medicare Other | Source: Ambulatory Visit | Attending: Nurse Practitioner | Admitting: Nurse Practitioner

## 2016-11-22 VITALS — BP 172/90 | HR 73 | Temp 97.9°F | Ht 64.5 in | Wt 159.0 lb

## 2016-11-22 DIAGNOSIS — M791 Myalgia, unspecified site: Secondary | ICD-10-CM

## 2016-11-22 DIAGNOSIS — M546 Pain in thoracic spine: Secondary | ICD-10-CM

## 2016-11-22 DIAGNOSIS — M898X1 Other specified disorders of bone, shoulder: Secondary | ICD-10-CM | POA: Diagnosis not present

## 2016-11-22 DIAGNOSIS — G8929 Other chronic pain: Secondary | ICD-10-CM

## 2016-11-22 LAB — HEPATIC FUNCTION PANEL
ALBUMIN: 4.3 g/dL (ref 3.5–5.2)
ALK PHOS: 70 U/L (ref 39–117)
ALT: 12 U/L (ref 0–35)
AST: 20 U/L (ref 0–37)
Bilirubin, Direct: 0.1 mg/dL (ref 0.0–0.3)
TOTAL PROTEIN: 7.7 g/dL (ref 6.0–8.3)
Total Bilirubin: 0.5 mg/dL (ref 0.2–1.2)

## 2016-11-22 LAB — BASIC METABOLIC PANEL
BUN: 14 mg/dL (ref 6–23)
CHLORIDE: 103 meq/L (ref 96–112)
CO2: 29 mEq/L (ref 19–32)
Calcium: 9.5 mg/dL (ref 8.4–10.5)
Creatinine, Ser: 0.87 mg/dL (ref 0.40–1.20)
GFR: 67.75 mL/min (ref >60.00)
GLUCOSE: 98 mg/dL (ref 70–99)
POTASSIUM: 3.8 meq/L (ref 3.5–5.1)
SODIUM: 139 meq/L (ref 135–145)

## 2016-11-22 LAB — CBC
HEMATOCRIT: 42.2 % (ref 36.0–46.0)
HEMOGLOBIN: 13.9 g/dL (ref 12.0–15.0)
MCHC: 33 g/dL (ref 30.0–36.0)
MCV: 90.7 fl (ref 78.0–100.0)
PLATELETS: 238 10*3/uL (ref 150.0–400.0)
RBC: 4.65 Mil/uL (ref 3.87–5.11)
RDW: 13.2 % (ref 11.5–15.5)
WBC: 7.9 10*3/uL (ref 4.0–10.5)

## 2016-11-22 LAB — TSH: TSH: 3.68 u[IU]/mL (ref 0.35–4.50)

## 2016-11-22 MED ORDER — METHOCARBAMOL 500 MG PO TABS: 500.0000 mg | ORAL_TABLET | Freq: Three times a day (TID) | ORAL | 0 refills | Status: DC | PRN

## 2016-11-22 MED ORDER — NAPROXEN 500 MG PO TABS: 500.0000 mg | ORAL_TABLET | Freq: Two times a day (BID) | ORAL | 0 refills | Status: DC

## 2016-11-22 NOTE — Progress Notes (Signed)
Subjective:  Patient ID: Elizabeth Moses, female    DOB: 03/19/1946  Age: 71 y.o. MRN: [id number]  CC: Back Pain (back pain and sore on shoulders 7/10 stated--ibuprofen or tylenol--not helping. left leg has bump? from fail in May)   Back Pain  This is a recurrent problem. The current episode started 1 to 4 weeks ago. The problem occurs intermittently. The problem has been waxing and waning since onset. The pain is present in the thoracic spine. The quality of the pain is described as aching and stabbing. The pain does not radiate. The symptoms are aggravated by twisting. Pertinent negatives include no abdominal pain, chest pain, numbness, paresis, paresthesias, tingling, weakness or weight loss. Risk factors include recent trauma and menopause. She has tried NSAIDs for the symptoms. The treatment provided no relief.   No improvement with tylenol and ibuprofen.  Outpatient Medications Prior to Visit  Medication Sig Dispense Refill  . Coenzyme Q10 (CO Q 10 PO) Take by mouth daily.    . magnesium 30 MG tablet Take 30 mg by mouth 2 (two) times daily.    . Multiple Vitamin (MULTIVITAMIN) tablet Take 1 tablet by mouth daily.    . vitamin B-12 (CYANOCOBALAMIN) 100 MCG tablet Take 100 mcg by mouth daily.     No facility-administered medications prior to visit.     ROS See HPI  Objective:  BP (!) 172/90   Pulse 73   Temp 97.9 F (36.6 C)   Ht 5' 4.5" (1.638 m)   Wt 159 lb (72.1 kg)   LMP 04/27/1995   SpO2 99%   BMI 26.87 kg/m   BP Readings from Last 3 Encounters:  11/22/16 (!) 172/90  08/10/16 128/70  11/11/15 (!) 150/90    Wt Readings from Last 3 Encounters:  11/22/16 159 lb (72.1 kg)  08/10/16 155 lb (70.3 kg)  11/11/15 157 lb (71.2 kg)    Physical Exam  Constitutional: She is oriented to person, place, and time. No distress.  Neck: Normal range of motion. Neck supple. No thyromegaly present.  Cardiovascular: Normal rate and regular rhythm.   Pulmonary/Chest: Effort  normal and breath sounds normal. No respiratory distress. She has no wheezes. She has no rales.  Abdominal: Soft.  Musculoskeletal: Normal range of motion. She exhibits no edema, tenderness or deformity.       Right shoulder: Normal.       Left shoulder: Normal.       Cervical back: Normal.       Thoracic back: Normal.  Neurological: She is alert and oriented to person, place, and time.  Skin: Skin is warm and dry. No rash noted. No erythema.  Vitals reviewed.   Lab Results  Component Value Date   WBC 7.9 11/22/2016   HGB 13.9 11/22/2016   HCT 42.2 11/22/2016   PLT 238.0 11/22/2016   GLUCOSE 98 11/22/2016   CHOL 189 07/22/2015   TRIG 55.0 07/22/2015   HDL 59.70 07/22/2015   LDLCALC 119 (H) 07/22/2015   ALT 12 11/22/2016   AST 20 11/22/2016   NA 139 11/22/2016   K 3.8 11/22/2016   CL 103 11/22/2016   CREATININE 0.87 11/22/2016   BUN 14 11/22/2016   CO2 29 11/22/2016   TSH 3.68 11/22/2016    No results found.  Assessment & Plan:   [name] was seen today for back pain.  Diagnoses and all orders for this visit:  Pain of left scapula -     DG Thoracic Spine W/Swimmers;  Future -     naproxen (NAPROSYN) 500 MG tablet; Take 1 tablet (500 mg total) by mouth 2 (two) times daily with a meal. -     methocarbamol (ROBAXIN) 500 MG tablet; Take 1 tablet (500 mg total) by mouth every 8 (eight) hours as needed for muscle spasms.  Chronic midline thoracic back pain -     DG Thoracic Spine W/Swimmers; Future -     naproxen (NAPROSYN) 500 MG tablet; Take 1 tablet (500 mg total) by mouth 2 (two) times daily with a meal. -     methocarbamol (ROBAXIN) 500 MG tablet; Take 1 tablet (500 mg total) by mouth every 8 (eight) hours as needed for muscle spasms.  Myalgia -     Basic metabolic panel; Future -     CBC; Future -     Hepatic function panel; Future -     TSH; Future -     naproxen (NAPROSYN) 500 MG tablet; Take 1 tablet (500 mg total) by mouth 2 (two) times daily with a  meal. -     methocarbamol (ROBAXIN) 500 MG tablet; Take 1 tablet (500 mg total) by mouth every 8 (eight) hours as needed for muscle spasms.   I am having Ms. Inclan start on naproxen and methocarbamol. I am also having her maintain her multivitamin, Coenzyme Q10 (CO Q 10 PO), vitamin B-12, and magnesium.  Meds ordered this encounter  Medications  . naproxen (NAPROSYN) 500 MG tablet    Sig: Take 1 tablet (500 mg total) by mouth 2 (two) times daily with a meal.    Dispense:  14 tablet    Refill:  0    Order Specific Question:   Supervising Provider    Answer:   Tresa GarterPLOTNIKOV, ALEKSEI V [[id number]]  . methocarbamol (ROBAXIN) 500 MG tablet    Sig: Take 1 tablet (500 mg total) by mouth every 8 (eight) hours as needed for muscle spasms.    Dispense:  21 tablet    Refill:  0    Order Specific Question:   Supervising Provider    Answer:   Tresa GarterPLOTNIKOV, ALEKSEI V [[id number]]    Follow-up: Return if symptoms worsen or fail to improve.  Alysia Pennaharlotte Tyerra Loretto, NP

## 2016-11-23 ENCOUNTER — Telehealth: Payer: Self-pay | Admitting: Nurse Practitioner

## 2016-11-23 DIAGNOSIS — M546 Pain in thoracic spine: Principal | ICD-10-CM

## 2016-11-23 DIAGNOSIS — G8929 Other chronic pain: Secondary | ICD-10-CM

## 2016-11-23 MED ORDER — PREDNISONE 10 MG (21) PO TBPK: ORAL_TABLET | ORAL | 0 refills | Status: DC

## 2016-11-23 NOTE — Telephone Encounter (Signed)
[facility] send massage stating pt can not take naproxen 500 mg PO due to side effects: losing vision in one eye. Can we send in something else to help with the pain.

## 2016-11-23 NOTE — Telephone Encounter (Signed)
Pt is aware.  

## 2016-11-30 ENCOUNTER — Encounter: Payer: Self-pay | Admitting: Family Medicine

## 2016-11-30 ENCOUNTER — Ambulatory Visit (INDEPENDENT_AMBULATORY_CARE_PROVIDER_SITE_OTHER): Payer: Medicare Other | Admitting: Family Medicine

## 2016-11-30 VITALS — BP 150/70 | HR 63 | Ht 64.5 in | Wt 159.0 lb

## 2016-11-30 DIAGNOSIS — M546 Pain in thoracic spine: Secondary | ICD-10-CM | POA: Diagnosis not present

## 2016-11-30 MED ORDER — GABAPENTIN 100 MG PO CAPS: 100.0000 mg | ORAL_CAPSULE | Freq: Three times a day (TID) | ORAL | 1 refills | Status: DC

## 2016-11-30 MED ORDER — TRAMADOL HCL 50 MG PO TABS: 50.0000 mg | ORAL_TABLET | Freq: Three times a day (TID) | ORAL | 0 refills | Status: DC | PRN

## 2016-11-30 NOTE — Patient Instructions (Signed)
Thank you for coming in,   I will call you tomorrow after the MRI is completed. He can take the tramadol for pain. The gabapentin is for nerve pain.   Please feel free to call with any questions or concerns at any time, at [phone]. --Dr. Jordan LikesSchmitz

## 2016-11-30 NOTE — Assessment & Plan Note (Signed)
There is concern that she has nerve compression with the representation of her radicular type pain as well as the severity of it. Possible for discitis versus an osteomyelitis with associated chills and feeling of malaise. Does not appear to have any form of compression fracture based on the history. - MRI with and without contrast of the thoracic spine - I will call once this is completed to determine the next step in her treatment plan.  - Gabapentin for nerve type pain and tramadol also provided.

## 2016-11-30 NOTE — Progress Notes (Signed)
Elizabeth Moses - 71 y.o. female MRN [id number]  Date of birth: 04-Dec-1945  SUBJECTIVE:  Including CC & ROS.  Chief Complaint  Patient presents with  . Back Pain    Patient states she woke up sweating in excruciating pain on left side. Patient tried to play tennis this morning and had to stop because she felt sick with lump in throat. Patient wants to make sure this has nothing to do with her heart.     Elizabeth Moses is a 71 yo FThat is presenting with left-sided thoracic back pain. She was seen about a month ago and was provided prednisone. This helped minimally. Her pain is gotten constant in nature and is significant. The pain is not relieved with ibuprofen or Tylenol. The pain starts in her left thoracic spine and radiates around to her front. She denies any trauma or history of any back injury or surgery. She does have some chills at night. She does not endorse fever she had x-rays completed that were normal in appearance.  She has pain with any movement. She denies any coughing. No history of cancer. No new medications and no travel out of country. Has not been sick recently. She denies any numbness or tingling. Has some cold sensation in her left arm. No chest pain or shortness of breath.      Review of Systems  Musculoskeletal: Positive for back pain. Negative for gait problem.  Skin: Negative for rash.  Neurological: Negative for weakness and numbness.  otherwise negative  HISTORY: Past Medical, Surgical, Social, and Family History Reviewed & Updated per EMR.   Pertinent Historical Findings include:  Past Medical History:  Diagnosis Date  . History of blood transfusion    As a child from father  . Menometrorrhagia 05/1988    Past Surgical History:  Procedure Laterality Date  . COMBINED HYSTEROSCOPY DIAGNOSTIC / D&C  08/1989   Benign  . KNEE ARTHROSCOPY Right 01/2003  . KNEE ARTHROSCOPY Left 08/2003  . MANDIBLE SURGERY  06/1989   Reconstructive  . MOHS SURGERY  12/13   on forehead  . MOHS SURGERY Left 04/2016   face  . SHOULDER ARTHROSCOPY Left 03/1996    Allergies  Allergen Reactions  . Lodine [Etodolac] Rash  . Penicillins Rash    Family History  Problem Relation Age of Onset  . Heart disease Mother   . Hypertension Mother   . Thyroid disease Mother   . Heart disease Father   . Epilepsy Brother   . Heart disease Maternal Grandfather 26  . Heart disease Paternal Grandfather 58  . Other Sister        breast biopsy  . Osteoporosis Sister      Social History   Social History  . Marital status: Married    Spouse name: N/A  . Number of children: N/A  . Years of education: N/A   Occupational History  . Not on file.   Social History Main Topics  . Smoking status: Never Smoker  . Smokeless tobacco: Never Used  . Alcohol use No  . Drug use: No  . Sexual activity: No   Other Topics Concern  . Not on file   Social History Narrative  . No narrative on file     PHYSICAL EXAM:  VS: BP (!) 150/70 (BP Location: Left Arm, Patient Position: Sitting, Cuff Size: Normal)   Pulse 63   Ht 5' 4.5" (1.638 m)   Wt 159 lb (72.1 kg)   LMP 04/27/1995  SpO2 100%   BMI 26.87 kg/m  Physical Exam  Constitutional: She is oriented to person, place, and time. She appears well-developed and well-nourished.  HENT:  Head: Normocephalic and atraumatic.  Eyes: Conjunctivae and EOM are normal.  Neck: Normal range of motion. Neck supple.  Cardiovascular: Normal rate, regular rhythm and normal heart sounds.   Pulmonary/Chest: Effort normal and breath sounds normal.  Musculoskeletal:  Back:  No tenderness to palpation over the thoracic spine. No tenderness to palpation over the scapular stabilizer muscles. Normal scapular range of motion and function. No wheezing on the scapula. Normal range of motion of both shoulders. No rash or vesicles present. No overlying swelling, erythema, or ecchymosis. Neurovascularly intact.  Neurological: She is  alert and oriented to person, place, and time.  Skin: Skin is warm. No rash noted.  Psychiatric: She has a normal mood and affect. Her behavior is normal.       ASSESSMENT & PLAN:   Acute left-sided thoracic back pain There is concern that she has nerve compression with the representation of her radicular type pain as well as the severity of it. Possible for discitis versus an osteomyelitis with associated chills and feeling of malaise. Does not appear to have any form of compression fracture based on the history. - MRI with and without contrast of the thoracic spine - I will call once this is completed to determine the next step in her treatment plan.  - Gabapentin for nerve type pain and tramadol also provided.

## 2016-12-01 ENCOUNTER — Ambulatory Visit
Admission: RE | Admit: 2016-12-01 | Discharge: 2016-12-01 | Disposition: A | Discharge status: Home / Self Care | Payer: Medicare Other | Source: Ambulatory Visit | Attending: Family Medicine | Admitting: Family Medicine

## 2016-12-01 ENCOUNTER — Telehealth: Payer: Self-pay | Admitting: Internal Medicine

## 2016-12-01 DIAGNOSIS — M546 Pain in thoracic spine: Secondary | ICD-10-CM

## 2016-12-01 MED ORDER — GADOBENATE DIMEGLUMINE 529 MG/ML IV SOLN
14.0000 mL | Freq: Once | INTRAVENOUS | Status: AC | PRN
  Administered 2016-12-01: 14 mL via INTRAVENOUS

## 2016-12-01 NOTE — Telephone Encounter (Signed)
Patient states if Dr. Jordan LikesSchmitz gets MRI results this evening to call her at [phone]

## 2016-12-02 NOTE — Telephone Encounter (Signed)
Contacted patient and informed her to take her medication as prescribed and if she is not feeling any better to make a follow up appointment.

## 2016-12-02 NOTE — Telephone Encounter (Signed)
Pt called back checking on this. I gave her MD response "Please inform that her MRI was normal. Thanks." She wanted to know what she needed to do now? Does she need to continue taking her medication or how should she follow up?

## 2017-05-06 DIAGNOSIS — H6991 Unspecified Eustachian tube disorder, right ear: Secondary | ICD-10-CM | POA: Insufficient documentation

## 2017-10-31 ENCOUNTER — Encounter

## 2017-10-31 ENCOUNTER — Other Ambulatory Visit (HOSPITAL_COMMUNITY)
Admission: RE | Admit: 2017-10-31 | Discharge: 2017-10-31 | Disposition: A | Discharge status: Home / Self Care | Payer: Medicare Other | Source: Ambulatory Visit | Attending: Obstetrics & Gynecology | Admitting: Obstetrics & Gynecology

## 2017-10-31 ENCOUNTER — Ambulatory Visit (INDEPENDENT_AMBULATORY_CARE_PROVIDER_SITE_OTHER): Payer: Medicare Other | Admitting: Obstetrics & Gynecology

## 2017-10-31 ENCOUNTER — Other Ambulatory Visit: Payer: Self-pay

## 2017-10-31 ENCOUNTER — Encounter: Payer: Self-pay | Admitting: Obstetrics & Gynecology

## 2017-10-31 VITALS — BP 136/70 | HR 88 | Resp 16 | Ht 65.0 in | Wt 158.2 lb

## 2017-10-31 DIAGNOSIS — Z124 Encounter for screening for malignant neoplasm of cervix: Secondary | ICD-10-CM | POA: Diagnosis present

## 2017-10-31 DIAGNOSIS — Z01419 Encounter for gynecological examination (general) (routine) without abnormal findings: Secondary | ICD-10-CM | POA: Diagnosis not present

## 2017-10-31 NOTE — Progress Notes (Signed)
72 y.o. [id number] MarriedCaucasianF here for annual exam.  Doing well.  Second grandson has a son and [place]  She and her husband are leaving today to go visit.  He will be a Biochemist, clinicalCaptain in [employer].  [place] will be moving to [place] later this year.    Denies vaginal bleeding.    Patient's last menstrual period was 04/27/1995.          Sexually active: No.  The current method of family planning is post menopausal status.    Exercising: Yes.    walking, tennis Smoker:  no  Health Maintenance: Pap:  08/01/15 neg   05/17/13 Neg  History of abnormal Pap:  no MMG:  07/30/16 BIRADS1:Neg. 09/2017 Normal  Colonoscopy:  06/15/06 Normal. Cologuard 08/17/16 Neg  BMD:   06/11/14 Normal  TDaP:  2013 Pneumonia vaccine(s):  2017 Shingrix:   Completed  Hep C testing: 07/22/15 neg  Screening Labs: UTD   reports that she has never smoked. She has never used smokeless tobacco. She reports that she does not drink alcohol or use drugs.  Past Medical History:  Diagnosis Date  . History of blood transfusion    As a child from father  . Menometrorrhagia 05/1988    Past Surgical History:  Procedure Laterality Date  . COMBINED HYSTEROSCOPY DIAGNOSTIC / D&C  08/1989   Benign  . KNEE ARTHROSCOPY Right 01/2003  . KNEE ARTHROSCOPY Left 08/2003  . MANDIBLE SURGERY  06/1989   Reconstructive  . MOHS SURGERY  12/13   on forehead  . MOHS SURGERY Left 04/2016   face  . SHOULDER ARTHROSCOPY Left 03/1996    Current Outpatient Medications  Medication Sig Dispense Refill  . Coenzyme Q10 (CO Q 10 PO) Take by mouth daily.    . Multiple Vitamin (MULTIVITAMIN) tablet Take 1 tablet by mouth daily.     No current facility-administered medications for this visit.     Family History  Problem Relation Age of Onset  . Heart disease Mother   . Hypertension Mother   . Thyroid disease Mother   . Heart disease Father   . Epilepsy Brother   . Heart disease Maternal Grandfather [id number]  . Heart disease  Paternal Grandfather [id number]  . Other Sister        breast biopsy  . Osteoporosis Sister     Review of Systems  All other systems reviewed and are negative.   Exam:   BP 136/70 (BP Location: Right Arm, Patient Position: Sitting, Cuff Size: Large)   Pulse 88   Resp 16   Ht 5\' 5"  (1.651 m)   Wt 158 lb 3.2 oz (71.8 kg)   LMP 04/27/1995   BMI 26.33 kg/m     Height: 5\' 5"  (165.1 cm)  Ht Readings from Last 3 Encounters:  10/31/17 5\' 5"  (1.651 m)  11/30/16 5' 4.5" (1.638 m)  11/22/16 5' 4.5" (1.638 m)    General appearance: alert, cooperative and appears stated age Head: Normocephalic, without obvious abnormality, atraumatic Neck: no adenopathy, supple, symmetrical, trachea midline and thyroid normal to inspection and palpation Lungs: clear to auscultation bilaterally Breasts: normal appearance, no masses or tenderness Heart: regular rate and rhythm Abdomen: soft, non-tender; bowel sounds normal; no masses,  no organomegaly Extremities: extremities normal, atraumatic, no cyanosis or edema Skin: Skin color, texture, turgor normal. No rashes or lesions Lymph nodes: Cervical, supraclavicular, and axillary nodes normal. No abnormal inguinal nodes palpated Neurologic: Grossly normal   Pelvic: External genitalia:  no lesions              Urethra:  normal appearing urethra with no masses, tenderness or lesions              Bartholins and Skenes: normal                 Vagina: normal appearing vagina with normal color and discharge, no lesions              Cervix: no lesions              Pap taken: Yes.   Bimanual Exam:  Uterus:  normal size, contour, position, consistency, mobility, non-tender              Adnexa: normal adnexa and no mass, fullness, tenderness               Rectovaginal: Confirms               Anus:  normal sphincter tone, no lesions  Chaperone was present for exam.  A:  Well Woman with normal exam PMP, no HRT Grade C breast density H/o SCC, followed by  dermatology.  H/O Mohs surgery x 2. Mildly elevated lipids  P:   Mammogram guidelines reviewed.   pap smear obtained today Lab work and vaccines are UTD Pt is going to consider seeing me in two years instead of one and knows to call with any new symptoms/concerns.

## 2017-11-02 LAB — CYTOLOGY - PAP
Diagnosis: NEGATIVE
HPV: NOT DETECTED

## 2017-11-15 ENCOUNTER — Encounter: Payer: Self-pay | Admitting: Obstetrics & Gynecology

## 2018-01-09 ENCOUNTER — Encounter: Payer: Self-pay | Admitting: Obstetrics & Gynecology

## 2018-01-09 ENCOUNTER — Other Ambulatory Visit: Payer: Self-pay

## 2018-01-09 ENCOUNTER — Ambulatory Visit: Payer: Medicare Other | Admitting: Obstetrics & Gynecology

## 2018-01-09 VITALS — BP 148/86 | HR 72 | Resp 14 | Ht 65.0 in | Wt 161.2 lb

## 2018-01-09 DIAGNOSIS — R3915 Urgency of urination: Secondary | ICD-10-CM

## 2018-01-09 LAB — POCT URINALYSIS DIPSTICK
Bilirubin, UA: NEGATIVE
Glucose, UA: NEGATIVE
Ketones, UA: NEGATIVE
LEUKOCYTES UA: NEGATIVE
Nitrite, UA: NEGATIVE
PH UA: 5 (ref 5.0–8.0)
Protein, UA: NEGATIVE
RBC UA: NEGATIVE
UROBILINOGEN UA: 0.2 U/dL

## 2018-01-09 MED ORDER — PHENAZOPYRIDINE HCL 100 MG PO TABS: 100.0000 mg | ORAL_TABLET | Freq: Three times a day (TID) | ORAL | 0 refills | Status: DC | PRN

## 2018-01-09 MED ORDER — NITROFURANTOIN MONOHYD MACRO 100 MG PO CAPS: 100.0000 mg | ORAL_CAPSULE | Freq: Two times a day (BID) | ORAL | 0 refills | Status: DC

## 2018-01-09 NOTE — Progress Notes (Signed)
GYNECOLOGY  VISIT  CC:   Urinary urgency  HPI: 72 y.o. [id number] Married White or Caucasian female here for urinary urgency x 2 weeks.  This started after having burning with urination.  She started using a cream that she's had for about 4 years--nystatin.  This made the symptoms worse.  She is drinking a lot more water to try and see if this will help.  Denies hematuria.  She is having urgency and hesitancy with urination.  She is getting up at night the last few days and this is new as well.  Denies fever and back pain.    GYNECOLOGIC HISTORY: Patient's last menstrual period was 04/27/1995. Contraception: post menopausal  Menopausal hormone therapy: none  Patient Active Problem List   Diagnosis Date Noted  . Acute left-sided thoracic back pain 11/30/2016  . Atypical chest pain 02/14/2015  . White coat hypertension 07/18/2014  . Routine general medical examination at a health care facility 07/18/2014    Past Medical History:  Diagnosis Date  . History of blood transfusion    As a child from father  . Menometrorrhagia 05/1988    Past Surgical History:  Procedure Laterality Date  . COMBINED HYSTEROSCOPY DIAGNOSTIC / D&C  08/1989   Benign  . KNEE ARTHROSCOPY Right 01/2003  . KNEE ARTHROSCOPY Left 08/2003  . MANDIBLE SURGERY  06/1989   Reconstructive  . MOHS SURGERY  12/13   on forehead  . MOHS SURGERY Left 04/2016   face  . SHOULDER ARTHROSCOPY Left 03/1996    MEDS:   Current Outpatient Medications on File Prior to Visit  Medication Sig Dispense Refill  . Coenzyme Q10 (CO Q 10 PO) Take by mouth daily.    . Multiple Vitamin (MULTIVITAMIN) tablet Take 1 tablet by mouth daily.     No current facility-administered medications on file prior to visit.     ALLERGIES: Lodine [etodolac] and Penicillins  Family History  Problem Relation Age of Onset  . Heart disease Mother   . Hypertension Mother   . Thyroid disease Mother   . Heart disease Father   . Epilepsy Brother    . Heart disease Maternal Grandfather 68  . Heart disease Paternal Grandfather 50  . Other Sister        breast biopsy  . Osteoporosis Sister     SH:  Married, non smoker  Review of Systems  Genitourinary: Positive for urgency.       Vulvar burning   All other systems reviewed and are negative.   PHYSICAL EXAMINATION:    BP (!) 148/86 (BP Location: Right Arm, Patient Position: Sitting, Cuff Size: Large)   Pulse 72   Resp 14   Ht 5\' 5"  (1.651 m)   Wt 161 lb 3.2 oz (73.1 kg)   LMP 04/27/1995   BMI 26.83 kg/m     General appearance: alert, cooperative and appears stated age Abdomen: soft, non-tender; bowel sounds normal; no masses,  no organomegaly Lymph:  no inguinal LAD noted  Pelvic: External genitalia:  no lesions              Urethra:  Very erythematous, mildly tenderness to palpation, no lesions              Bartholins and Skenes: normal                 Vagina: normal appearing vagina with normal color and discharge, no lesions  Cervix: no lesions              Bimanual Exam:  Uterus:  normal size, contour, position, consistency, mobility, non-tender              Adnexa: no mass, fullness, tenderness  Chaperone was present for exam.  Assessment: Urinary urgency Urethral erythema  Plan: Macrobid 100mg  bid x 5 days Pyridium 100mg  tid.  #20/0RF Urine culture pending

## 2018-01-10 LAB — URINALYSIS, MICROSCOPIC ONLY
BACTERIA UA: NONE SEEN
Casts: NONE SEEN /lpf
Epithelial Cells (non renal): NONE SEEN /hpf (ref 0–10)

## 2018-01-11 ENCOUNTER — Telehealth: Payer: Self-pay

## 2018-01-11 LAB — URINE CULTURE

## 2018-01-11 NOTE — Telephone Encounter (Signed)
-----   Message from Jerene BearsMary S Miller, MD sent at 01/11/2018 11:45 AM EDT ----- Please let pt know her urine culture was negative.  Can you please get an update from her about how she is feeling?

## 2018-01-11 NOTE — Telephone Encounter (Signed)
Informed patient urine culture was negative.  She is still complaining of burning.  She is still taking the medication.

## 2018-01-12 NOTE — Telephone Encounter (Signed)
I think she may be traveling.  I'd like her to try hydrocortisone vaginal suppositories 25mg  PV nightly for next 7 nights.  This will help if there is any inflammation or irritation causing her symptoms.  Ok to send to pharmacy she requests.  Please ask her to give me an update in a few days.  Thanks.

## 2018-01-13 MED ORDER — HYDROCORTISONE ACETATE 25 MG RE SUPP: RECTAL | 0 refills | Status: DC

## 2018-01-13 NOTE — Telephone Encounter (Signed)
Spoke with patient and discussed message from Dr. Hyacinth MeekerMiller.  She states she feels much improved and would like to hold off on treatment at this time with vaginal suppositories.  Advised to please call back if symptoms return.  Update to Dr. Hyacinth MeekerMiller.  Will close phone note.

## 2018-01-13 NOTE — Telephone Encounter (Signed)
Left message for patient to contact us and let us know how she is feeling.  Rx for hydrocortisone vaginal suppository was sent to pharmacy.

## 2018-01-13 NOTE — Telephone Encounter (Signed)
Patient returned call to Shannon. 

## 2018-01-28 ENCOUNTER — Encounter: Payer: Self-pay | Admitting: Family Medicine

## 2018-01-28 ENCOUNTER — Ambulatory Visit: Payer: Medicare Other | Admitting: Family Medicine

## 2018-01-28 VITALS — BP 144/78 | HR 70 | Temp 98.0°F | Wt 161.0 lb

## 2018-01-28 DIAGNOSIS — M25512 Pain in left shoulder: Secondary | ICD-10-CM

## 2018-01-28 DIAGNOSIS — M545 Low back pain, unspecified: Secondary | ICD-10-CM

## 2018-01-28 DIAGNOSIS — R0989 Other specified symptoms and signs involving the circulatory and respiratory systems: Secondary | ICD-10-CM | POA: Diagnosis not present

## 2018-01-28 DIAGNOSIS — G8929 Other chronic pain: Secondary | ICD-10-CM

## 2018-01-28 DIAGNOSIS — R09A2 Foreign body sensation, throat: Secondary | ICD-10-CM

## 2018-01-28 NOTE — Patient Instructions (Addendum)
Please call your ENT doctor at [facility] if the feeling of something stuck in your throat persists. They can use a scope to look and make sure that all is well I have ordered x-rays of your back. Please call [facility] on Monday and ask if you can come in for your films- let them know that they are already ordered!  I will then call you after radiology has read the reports and we can plan the next step.  I suspect that physical therapy may be helpful for you.  We can order this assuming your x-rays appear reasonable

## 2018-01-28 NOTE — Progress Notes (Addendum)
[facility] [address] [place], [place] [address] [phone]  Date:  01/28/2018   Name:  Elizabeth Moses   DOB:  1945-05-31   MRN:  [id number]  PCP:  Myrlene Broker, MD    Chief Complaint: Back Pain (lower back pain and radiates up into her  back and her left shoulder) and Dysphagia (feels like something is stuck in her throat.  feels like she has hot flashes)   History of Present Illness:  Elizabeth Moses is a 72 y.o. very pleasant female patient who presents with the following:  Pt of Hillard Danker , here today with concern of a place in her throat that feels like something is stuck there, off and on for about 2 months but worse this week.   She is not aware of anything actually getting stuck such as a pill or bone, and she is able to swallow ok .  No issues with food or liquids getting lodged while going down  She has a sensation of something being stuck in her throat but no pain No recent cough  She does have an ENT doctor at [facility] who she last saw in January for another issue   She also has 2 areas of back pain, not sure if they are related.   About 3 weeks ago she had onset of mid to lower back pain.  This is present in both sides of her back at about her waistline, but does not radiate into her legs. No weakness or numbness of her legs.  She also has a tender area in her left upper back just at the scapula which is tender to press on, "it feels like a needle."  She has had this same left scapula pain in the past-  She had thought that this was evaluated this past May, but per chart she was seen for this issue in July of 2018. She was given robaxin/ naprosyn, later prednisone, and had films taken of her T spine as below:  11/22/16 THORACIC SPINE - 3 VIEWS  COMPARISON:  None. FINDINGS: Normal thoracic kyphosis. No evidence of fracture or dislocation. Vertebral body heights and intervertebral  disc spaces are maintained. Visualized lungs are clear. IMPRESSION: Negative.  She then came back and was seen again about a week later, saw Dr. Jordan Likes who ordered an MRI for her as well- it was normal  IMPRESSION: Negative MRI of the thoracic spine with and without contrast.  It seems like her shoulder blade pain got better, but came back more recently. She cannot tell me exactly when this did come back - the timeline is a bit vague   She notes that a couple of weeks ago she played a lot of tennis in the hot sun and then developed muscle cramps, but this resolved after a few hours  Patient Active Problem List   Diagnosis Date Noted  . Acute left-sided thoracic back pain 11/30/2016  . Atypical chest pain 02/14/2015  . White coat hypertension 07/18/2014  . Routine general medical examination at a health care facility 07/18/2014    Past Medical History:  Diagnosis Date  . History of blood transfusion    As a child from father  . Menometrorrhagia 05/1988    Past Surgical History:  Procedure Laterality Date  . COMBINED HYSTEROSCOPY DIAGNOSTIC / D&C  08/1989   Benign  . KNEE ARTHROSCOPY Right 01/2003  . KNEE ARTHROSCOPY Left 08/2003  .  MANDIBLE SURGERY  06/1989   Reconstructive  . MOHS SURGERY  12/13   on forehead  . MOHS SURGERY Left 04/2016   face  . SHOULDER ARTHROSCOPY Left 03/1996    Social History   Tobacco Use  . Smoking status: Never Smoker  . Smokeless tobacco: Never Used  Substance Use Topics  . Alcohol use: No  . Drug use: No    Family History  Problem Relation Age of Onset  . Heart disease Mother   . Hypertension Mother   . Thyroid disease Mother   . Heart disease Father   . Epilepsy Brother   . Heart disease Maternal Grandfather 59  . Heart disease Paternal Grandfather 20  . Other Sister        breast biopsy  . Osteoporosis Sister     Allergies  Allergen Reactions  . Lodine [Etodolac] Rash  . Penicillins Rash    Medication list has  been reviewed and updated.  Current Outpatient Medications on File Prior to Visit  Medication Sig Dispense Refill  . Coenzyme Q10 (CO Q 10 PO) Take by mouth daily.    . hydrocortisone (ANUSOL-HC) 25 MG suppository Place vaginally at night for 7 nights. 7 suppository 0  . Multiple Vitamin (MULTIVITAMIN) tablet Take 1 tablet by mouth daily.     No current facility-administered medications on file prior to visit.     Review of Systems:  As per HPI- otherwise negative. No CP or SOB No fever    Physical Examination: Vitals:   01/28/18 0947  BP: (!) 144/78  Pulse: 70  Temp: 98 F (36.7 C)  SpO2: 100%   Vitals:   01/28/18 0947  Weight: 161 lb (73 kg)   Body mass index is 26.79 kg/m. Ideal Body Weight:    GEN: WDWN, NAD, Non-toxic, A & O x 3, looks well  HEENT: Atraumatic, Normocephalic. Neck supple. No masses, No LAD. Bilateral TM wnl, oropharynx normal.  PEERL,EOMI.   I cannot visualize anything to explain her FB sensation on oral exam  Ears and Nose: No external deformity. CV: RRR, No M/G/R. No JVD. No thrill. No extra heart sounds. PULM: CTA B, no wheezes, crackles, rhonchi. No retractions. No resp. distress. No accessory muscle use. ABD: S, NT, ND EXTR: No c/c/e NEURO Normal gait.  PSYCH: Normally interactive. Conversant. Not depressed or anxious appearing.  Calm demeanor.  She has tenderness in the muscles of her bilateral lower back at her waistline.  She also has a discrete, definite area of tenderness in her left upper back just at the lower border of her scapula   Normal BUE strength, DTR    Assessment and Plan: Chronic low back pain without sciatica, unspecified back pain laterality - Plan: DG Lumbar Spine Complete, DG Thoracic Spine 2 View  Foreign body sensation in throat  Chronic left shoulder pain  Explained that if her FB in the throat sensation persists she likely needs a scope- she will contact her ENT doc if needed She also has concern of both lower  back and left scapula pain. The scapula pain was first evaluated a little over a year ago and she did have an MRI which was benign  Her left scapular pain is long standing and very easily reproducible so do not suspect something more ominous than MSK pain.  However the exact cause is difficult to determine. Had thought of doing x-rays but then realized that she already had an MRI so unlikely to be helpful. Decided to have  her see sports med instead and she is agreeable to this plan  The more lower back pain is likely lumbago and benign She will seek care right away if any change in her symptoms or other concerns    Signed Abbe Amsterdam, MD

## 2018-01-28 NOTE — Addendum Note (Signed)
Addended by: Abbe Amsterdam C on: 01/28/2018 12:41 PM   Modules accepted: Orders

## 2018-02-06 NOTE — Progress Notes (Signed)
[facility] [address] [place], [place] [address] Phone: [phone] Subjective:    I Ronelle Nigh am serving as a Neurosurgeon for Dr. Antoine Primas.    CC: Back pain    BJY:[id number]  Elizabeth Moses is a 72 y.o. female coming in with complaint of low back pain. Pain radiates to the hips. Painful everywhere. Toes on the left foot are sometimes numb. Right foot a few toes are numb. Played tennis yesterday and couldn't finish the match. Physical activity gives her extreme pain. History of this type of pain last year and was given tramadol and gabapentin. Tramadol helps a little. Today she is feeling better than usual. Very miserable pain.   Onset- 1 month Location- Pain is everywhere but radiates to the hips Character- Sharp Aggravating factors- Reliving factors-  Therapies tried- Heating pads, oral meds Severity-9 out of 10.     Past Medical History:  Diagnosis Date  . History of blood transfusion    As a child from father  . Menometrorrhagia 05/1988   Past Surgical History:  Procedure Laterality Date  . COMBINED HYSTEROSCOPY DIAGNOSTIC / D&C  08/1989   Benign  . KNEE ARTHROSCOPY Right 01/2003  . KNEE ARTHROSCOPY Left 08/2003  . MANDIBLE SURGERY  06/1989   Reconstructive  . MOHS SURGERY  12/13   on forehead  . MOHS SURGERY Left 04/2016   face  . SHOULDER ARTHROSCOPY Left 03/1996   Social History   Socioeconomic History  . Marital status: Married    Spouse name: Not on file  . Number of children: Not on file  . Years of education: Not on file  . Highest education level: Not on file  Occupational History  . Not on file  Social Needs  . Financial resource strain: Not on file  . Food insecurity:    Worry: Not on file    Inability: Not on file  . Transportation needs:    Medical: Not on file    Non-medical: Not on file  Tobacco Use  . Smoking status: Never Smoker  . Smokeless tobacco: Never Used  Substance and Sexual  Activity  . Alcohol use: No  . Drug use: No  . Sexual activity: Not Currently    Partners: Male  Lifestyle  . Physical activity:    Days per week: Not on file    Minutes per session: Not on file  . Stress: Not on file  Relationships  . Social connections:    Talks on phone: Not on file    Gets together: Not on file    Attends religious service: Not on file    Active member of club or organization: Not on file    Attends meetings of clubs or organizations: Not on file    Relationship status: Not on file  Other Topics Concern  . Not on file  Social History Narrative  . Not on file   Allergies  Allergen Reactions  . Lodine [Etodolac] Rash  . Penicillins Rash   Family History  Problem Relation Age of Onset  . Heart disease Mother   . Hypertension Mother   . Thyroid disease Mother   . Heart disease Father   . Epilepsy Brother   . Heart disease Maternal Grandfather 28  . Heart disease Paternal Grandfather 60  . Other Sister        breast biopsy  . Osteoporosis Sister          Current Outpatient Medications (Other):  .  Coenzyme Q10 (CO Q 10 PO), Take by mouth daily. .  Multiple Vitamin (MULTIVITAMIN) tablet, Take 1 tablet by mouth daily.    Past medical history, social, surgical and family history all reviewed in electronic medical record.  No pertanent information unless stated regarding to the chief complaint.   Review of Systems:  No headache, visual changes, nausea, vomiting, diarrhea, constipation, dizziness, abdominal pain, skin rash, fevers, chills, night sweats, weight loss, swollen lymph nodes,chest pain, shortness of breath, mood changes.  Positive muscle aches, joint swelling, body aches  Objective  Blood pressure (!) 150/84, pulse 70, height 5\' 5"  (1.651 m), weight 162 lb (73.5 kg), last menstrual period 04/27/1995, SpO2 98 %.    General: No apparent distress alert and oriented x3 mood and affect normal, dressed appropriately.  HEENT: Pupils equal,  extraocular movements intact  Respiratory: Patient's speak in full sentences and does not appear short of breath  Cardiovascular: No lower extremity edema, non tender, no erythema  Skin: Warm dry intact with no signs of infection or rash on extremities or on axial skeleton.  Abdomen: Soft nontender  Neuro: Cranial nerves II through XII are intact, neurovascularly intact in all extremities with 2+ DTRs and 2+ pulses.  Lymph: No lymphadenopathy of posterior or anterior cervical chain or axillae bilaterally.  Gait antalgic.  MSK:  tender with full range of motion and good stability and symmetric strength and tone of shoulders, elbows, wrist, hip, knee and ankles bilaterally.   Neck exam shows the patient does have an increased kyphosis of the thoracic spine.  Loss of lordosis of the lumbar spine with mild degenerative scoliosis.  Severe tenderness of the sacroiliac joint bilaterally.  Positive Faber bilaterally.  Negative straight leg test.  Patient's pain is out of proportion to the amount of palpation in the back and numerous areas along the lateral aspect of the hips.  Neurovascularly intact distally with full strength.  [id number]; 15 additional minutes spent for Therapeutic exercises as stated in above notes.  This included exercises focusing on stretching, strengthening, with significant focus on eccentric aspects.   Long term goals include an improvement in range of motion, strength, endurance as well as avoiding reinjury. Patient's frequency would include in 1-2 times a day, 3-5 times a week for a duration of 6-12 weeks. Low back exercises that included:  Pelvic tilt/bracing instruction to focus on control of the pelvic girdle and lower abdominal muscles  Glute strengthening exercises, focusing on proper firing of the glutes without engaging the low back muscles Proper stretching techniques for maximum relief for the hamstrings, hip flexors, low back and some rotation where tolerated   Proper  technique shown and discussed handout in great detail with ATC.  All questions were discussed and answered.       Impression and Recommendations:     This case required medical decision making of moderate complexity. The above documentation has been reviewed and is accurate and complete Judi Saa, DO       Note: This dictation was prepared with Dragon dictation along with smaller phrase technology. Any transcriptional errors that result from this process are unintentional.

## 2018-02-07 ENCOUNTER — Ambulatory Visit (INDEPENDENT_AMBULATORY_CARE_PROVIDER_SITE_OTHER)
Admission: RE | Admit: 2018-02-07 | Discharge: 2018-02-07 | Disposition: A | Discharge status: Home / Self Care | Payer: Medicare Other | Source: Ambulatory Visit | Attending: Family Medicine | Admitting: Family Medicine

## 2018-02-07 ENCOUNTER — Other Ambulatory Visit (INDEPENDENT_AMBULATORY_CARE_PROVIDER_SITE_OTHER): Payer: Medicare Other

## 2018-02-07 ENCOUNTER — Encounter: Payer: Self-pay | Admitting: Family Medicine

## 2018-02-07 ENCOUNTER — Ambulatory Visit: Payer: Medicare Other | Admitting: Family Medicine

## 2018-02-07 VITALS — BP 150/84 | HR 70 | Ht 65.0 in | Wt 162.0 lb

## 2018-02-07 DIAGNOSIS — M2550 Pain in unspecified joint: Secondary | ICD-10-CM | POA: Insufficient documentation

## 2018-02-07 DIAGNOSIS — G8929 Other chronic pain: Secondary | ICD-10-CM

## 2018-02-07 DIAGNOSIS — M545 Low back pain, unspecified: Secondary | ICD-10-CM

## 2018-02-07 LAB — IBC PANEL
Iron: 123 ug/dL (ref 42–145)
Saturation Ratios: 32.3 % (ref 20.0–50.0)
TRANSFERRIN: 272 mg/dL (ref 212.0–360.0)

## 2018-02-07 LAB — COMPREHENSIVE METABOLIC PANEL
ALBUMIN: 4.3 g/dL (ref 3.5–5.2)
ALK PHOS: 77 U/L (ref 39–117)
ALT: 12 U/L (ref 0–35)
AST: 17 U/L (ref 0–37)
BUN: 17 mg/dL (ref 6–23)
CALCIUM: 9.7 mg/dL (ref 8.4–10.5)
CO2: 29 mEq/L (ref 19–32)
Chloride: 103 mEq/L (ref 96–112)
Creatinine, Ser: 0.82 mg/dL (ref 0.40–1.20)
GFR: 72.29 mL/min (ref >60.00)
Glucose, Bld: 92 mg/dL (ref 70–99)
POTASSIUM: 4 meq/L (ref 3.5–5.1)
Sodium: 138 mEq/L (ref 135–145)
TOTAL PROTEIN: 7.7 g/dL (ref 6.0–8.3)
Total Bilirubin: 0.6 mg/dL (ref 0.2–1.2)

## 2018-02-07 LAB — TSH: TSH: 2.53 u[IU]/mL (ref 0.35–4.50)

## 2018-02-07 LAB — CBC WITH DIFFERENTIAL/PLATELET
Basophils Absolute: 0 10*3/uL (ref 0.0–0.1)
Basophils Relative: 0.2 % (ref 0.0–3.0)
EOS PCT: 1.1 % (ref 0.0–5.0)
Eosinophils Absolute: 0.1 10*3/uL (ref 0.0–0.7)
HCT: 40.1 % (ref 36.0–46.0)
Hemoglobin: 13.4 g/dL (ref 12.0–15.0)
LYMPHS PCT: 27.4 % (ref 12.0–46.0)
Lymphs Abs: 1.8 10*3/uL (ref 0.7–4.0)
MCHC: 33.6 g/dL (ref 30.0–36.0)
MCV: 90.4 fl (ref 78.0–100.0)
MONOS PCT: 9.7 % (ref 3.0–12.0)
Monocytes Absolute: 0.6 10*3/uL (ref 0.1–1.0)
Neutro Abs: 4.1 10*3/uL (ref 1.4–7.7)
Neutrophils Relative %: 61.6 % (ref 43.0–77.0)
Platelets: 233 10*3/uL (ref 150.0–400.0)
RBC: 4.43 Mil/uL (ref 3.87–5.11)
RDW: 12.5 % (ref 11.5–15.5)
WBC: 6.6 10*3/uL (ref 4.0–10.5)

## 2018-02-07 LAB — SEDIMENTATION RATE: Sed Rate: 8 mm/hr (ref 0–30)

## 2018-02-07 LAB — URIC ACID: Uric Acid, Serum: 5.7 mg/dL (ref 2.4–7.0)

## 2018-02-07 LAB — FERRITIN: FERRITIN: 84.9 ng/mL (ref 10.0–291.0)

## 2018-02-07 LAB — C-REACTIVE PROTEIN: CRP: 0.1 mg/dL — ABNORMAL LOW (ref 0.5–20.0)

## 2018-02-07 NOTE — Patient Instructions (Signed)
Good to see you  Labs and xray downstairs Lets see what comes from this and then see if it gives [place] direction.  Ice 20 minutes 2 times daily. Usually after activity and before bed. pennsaid pinkie amount topically 2 times daily as needed.  Exercises 3 times a week.  Vitamin D 2000 IU daily  Turmeric 500mg  daily  Tart cherry extract any dose at night Stay active! See me again in 4 weeks

## 2018-02-07 NOTE — Assessment & Plan Note (Signed)
Low back pain, multifactorial, muscles likely are contributing.  I do think there is any balance.  X-rays ordered today.  Home exercises to work with Event organiser.  Discussed icing regimen topical anti-inflammatory trials given.  Will rule out but laboratory work-up other things that could potentially cause chronic pain such as hypercalcemia.  Follow-up again in 4 weeks

## 2018-02-07 NOTE — Assessment & Plan Note (Signed)
Patient is having pain out of proportion to the amount of palpation.  Patient does have significant loss of lordosis of the back and will get x-rays.  I feel that a laboratory work-up would be appropriate at this time.  Patient does give a long history of this and patient does have more findings that could be more systemic.  We discussed how vitamin D, iron, as well as inflammatory markers would be beneficial.  We will get this ordered today.  Depending on findings this could change medical management.  Discussed some over-the-counter medications that can help with the underlying arthritis.  We discussed the possibility of other further work-ups including advanced imaging such as an MRI as well as the possibility of a bone density.  Patient will follow-up with me again in 4 weeks and will discuss other treatment changes.

## 2018-02-12 LAB — VITAMIN D 1,25 DIHYDROXY
VITAMIN D 1, 25 (OH) TOTAL: 62 pg/mL (ref 18–72)
Vitamin D2 1, 25 (OH)2: <8 pg/mL
Vitamin D3 1, 25 (OH)2: 62 pg/mL

## 2018-02-12 LAB — RHEUMATOID FACTOR: Rhuematoid fact SerPl-aCnc: <14 IU/mL (ref <14)

## 2018-02-12 LAB — PTH, INTACT AND CALCIUM
Calcium: 9.5 mg/dL (ref 8.6–10.4)
PTH: 34 pg/mL (ref 14–64)

## 2018-02-12 LAB — ANGIOTENSIN CONVERTING ENZYME: ANGIOTENSIN-CONVERTING ENZYME: 28 U/L (ref 9–67)

## 2018-02-12 LAB — CALCIUM, IONIZED: Calcium, Ion: 5.17 mg/dL (ref 4.8–5.6)

## 2018-02-12 LAB — ANA: ANA: NEGATIVE

## 2018-02-12 LAB — CYCLIC CITRUL PEPTIDE ANTIBODY, IGG

## 2018-02-13 ENCOUNTER — Telehealth: Payer: Self-pay

## 2018-02-13 NOTE — Telephone Encounter (Signed)
I sent in a note for her in my chart I am pretty sure.  We can discuss at follow up  No change in care for now.

## 2018-02-13 NOTE — Telephone Encounter (Signed)
Copied from CRM [id number]. Topic: General - Other >> Feb 13, 2018 10:57 AM Ronney Lion A wrote: Reason for CRM: patient would like to go over the results from her Xray.   Please advise

## 2018-02-13 NOTE — Telephone Encounter (Signed)
Tried calling patient multiple times and the number will not go through. I called another patient to see if it was my phone and used another phone and did not go through. Routing to [facility] incase patient calls back.

## 2018-02-13 NOTE — Telephone Encounter (Signed)
Routing to Dr. Katrinka Blazing sine he ordered the x-ray

## 2018-02-14 NOTE — Telephone Encounter (Signed)
° °  Pt said she did not want to wait till 03/06/18 for her xray results and would like a call back

## 2018-03-03 NOTE — Progress Notes (Signed)
[facility] [address] [place], [place] [address] Phone: [phone] Subjective:     CC: Back pain  [id number]  Elizabeth Moses is a 72 y.o. female coming in with complaint of back pain. She said that she has made drastic improvement since last visit. Pain at 2/10. Is taking supplements and doing her HEP. Played tennis this morning without pain.  Patient overall is feeling significantly better.      Past Medical History:  Diagnosis Date  . History of blood transfusion    As a child from father  . Menometrorrhagia 05/1988   Past Surgical History:  Procedure Laterality Date  . COMBINED HYSTEROSCOPY DIAGNOSTIC / D&C  08/1989   Benign  . KNEE ARTHROSCOPY Right 01/2003  . KNEE ARTHROSCOPY Left 08/2003  . MANDIBLE SURGERY  06/1989   Reconstructive  . MOHS SURGERY  12/13   on forehead  . MOHS SURGERY Left 04/2016   face  . SHOULDER ARTHROSCOPY Left 03/1996   Social History   Socioeconomic History  . Marital status: Married    Spouse name: Not on file  . Number of children: Not on file  . Years of education: Not on file  . Highest education level: Not on file  Occupational History  . Not on file  Social Needs  . Financial resource strain: Not on file  . Food insecurity:    Worry: Not on file    Inability: Not on file  . Transportation needs:    Medical: Not on file    Non-medical: Not on file  Tobacco Use  . Smoking status: Never Smoker  . Smokeless tobacco: Never Used  Substance and Sexual Activity  . Alcohol use: No  . Drug use: No  . Sexual activity: Not Currently    Partners: Male  Lifestyle  . Physical activity:    Days per week: Not on file    Minutes per session: Not on file  . Stress: Not on file  Relationships  . Social connections:    Talks on phone: Not on file    Gets together: Not on file    Attends religious service: Not on file    Active member of club or organization: Not on file    Attends  meetings of clubs or organizations: Not on file    Relationship status: Not on file  Other Topics Concern  . Not on file  Social History Narrative  . Not on file   Allergies  Allergen Reactions  . Lodine [Etodolac] Rash  . Penicillins Rash   Family History  Problem Relation Age of Onset  . Heart disease Mother   . Hypertension Mother   . Thyroid disease Mother   . Heart disease Father   . Epilepsy Brother   . Heart disease Maternal Grandfather 36  . Heart disease Paternal Grandfather 56  . Other Sister        breast biopsy  . Osteoporosis Sister          Current Outpatient Medications (Other):  Marland Kitchen  Coenzyme Q10 (CO Q 10 PO), Take by mouth daily. .  Multiple Vitamin (MULTIVITAMIN) tablet, Take 1 tablet by mouth daily.    Past medical history, social, surgical and family history all reviewed in electronic medical record.  No pertanent information unless stated regarding to the chief complaint.   Review of Systems:  No headache, visual changes, nausea, vomiting, diarrhea, constipation, dizziness, abdominal pain, skin rash, fevers, chills, night sweats, weight  loss, swollen lymph nodes, body aches, joint swelling, , chest pain, shortness of breath, mood changes.  Mild positive muscle aches  Objective  Blood pressure (!) 126/92, pulse 77, height 5\' 5"  (1.651 m), weight 162 lb (73.5 kg), last menstrual period 04/27/1995, SpO2 97 %.    General: No apparent distress alert and oriented x3 mood and affect normal, dressed appropriately.  HEENT: Pupils equal, extraocular movements intact  Respiratory: Patient's speak in full sentences and does not appear short of breath  Cardiovascular: No lower extremity edema, non tender, no erythema  Skin: Warm dry intact with no signs of infection or rash on extremities or on axial skeleton.  Abdomen: Soft nontender  Neuro: Cranial nerves II through XII are intact, neurovascularly intact in all extremities with 2+ DTRs and 2+ pulses.    Lymph: No lymphadenopathy of posterior or anterior cervical chain or axillae bilaterally.  Gait normal with good balance and coordination.  MSK:  Non tender with full range of motion and good stability and symmetric strength and tone of shoulders, elbows, wrist, hip, knee and ankles bilaterally.  Patient back exam does have some mild loss of lordosis and some degenerative scoliosis but very mild overall.  Patient has good strength.  Mild tightness with Pearlean Brownie but no increase in discomfort and pain.  Deep tendon reflexes intact and symmetric.    Impression and Recommendations:      The above documentation has been reviewed and is accurate and complete Judi Saa, DO       Note: This dictation was prepared with Dragon dictation along with smaller phrase technology. Any transcriptional errors that result from this process are unintentional.

## 2018-03-06 ENCOUNTER — Ambulatory Visit: Payer: Medicare Other | Admitting: Family Medicine

## 2018-03-06 DIAGNOSIS — M546 Pain in thoracic spine: Secondary | ICD-10-CM

## 2018-03-06 NOTE — Patient Instructions (Signed)
Good to see you  I am proud of you  Turmeric up to 1000mg  for headache or 200mg  of CoQ10 You continue everything else you are doing  See me again in 2 months

## 2018-03-06 NOTE — Assessment & Plan Note (Signed)
Known arthritic changes with the back noted today.  Some degenerative disc disease noted on the last x-ray.  Patient has been doing very well.  We discussed icing regimen, home exercise and continue the conservative therapy.  Follow-up with me again in 2 months

## 2018-05-08 ENCOUNTER — Ambulatory Visit: Payer: Medicare Other | Admitting: Family Medicine

## 2019-01-08 ENCOUNTER — Encounter: Payer: Self-pay | Admitting: Obstetrics & Gynecology

## 2019-01-17 ENCOUNTER — Encounter: Payer: Self-pay | Admitting: Certified Nurse Midwife

## 2019-01-17 ENCOUNTER — Ambulatory Visit: Payer: Medicare Other | Admitting: Certified Nurse Midwife

## 2019-01-17 ENCOUNTER — Other Ambulatory Visit: Payer: Self-pay

## 2019-01-17 VITALS — BP 122/64 | HR 60 | Temp 97.1°F | Resp 16 | Wt 162.0 lb

## 2019-01-17 DIAGNOSIS — R3915 Urgency of urination: Secondary | ICD-10-CM | POA: Diagnosis not present

## 2019-01-17 DIAGNOSIS — N342 Other urethritis: Secondary | ICD-10-CM

## 2019-01-17 MED ORDER — NITROFURANTOIN MONOHYD MACRO 100 MG PO CAPS: ORAL_CAPSULE | ORAL | 0 refills | Status: DC

## 2019-01-17 NOTE — Patient Instructions (Signed)

## 2019-01-17 NOTE — Progress Notes (Signed)
73 y.o. Married Caucasian female [phone] here with complaint of  ? UTI, with onset  2 weeks ago, with sensation of burning with urination. Urine is not dark, no blood in urine. Some nocturia, which has increased. Does play tennis and notes occurrence with this also.. Some frequency at onset, and burning has increased. Patient denies fever, chills, nausea or back pain. No new personal products. Patient not  sexually active. Denies any vaginal symptoms. Patient feels she is drinking adequate water intake.  Review of Systems  Constitutional: Negative.   HENT: Negative.   Eyes: Negative.   Respiratory: Negative.   Cardiovascular: Negative.   Genitourinary: Negative.        Feels different than before with urgency and small amount of urine after empties.  Musculoskeletal: Negative.   Skin: Negative.   Neurological: Negative.   Endo/Heme/Allergies: Negative.   Psychiatric/Behavioral: Negative.     O: Healthy female WDWN Affect: Normal, orientation x 3 Skin : warm and dry CVAT: negative bilateral Abdomen: slight for suprapubic tenderness  Pelvic exam: External genital area: atrophic appearance, no lesions Bladder slightly tender,Urethra tender, Urethral meatus: tender, red Vagina: scant normal vaginal discharge, atrophic appearance   Cervix: normal, non tender Uterus:normal,non tender Adnexa: normal non tender, no fullness or masses  poct urine- unable to obtain A: Urethritis with UTI Vaginal atrophy/dryness Normal pelvic exam  P: Reviewed findings of UTI/urethritis and need for treatment. Discussed etiology and patient shown problem with mirror for understanding. [id number]:[id number] see order with instructions BTD:[id number] micro, culture patient unable to obtain, will return specimen when she collects at home Reviewed warning signs and symptoms of UTI and need to advise if occurring. Encouraged to limit soda, tea, and coffee and be sure to increase water intake. Discussed vaginal dryness and  which increases UTI occurrence. Suggested coconut oil to area around urinary meatus once treated to protect area. Questions addressed at length.   RV prn

## 2019-01-18 LAB — URINALYSIS, MICROSCOPIC ONLY
Bacteria, UA: NONE SEEN
RBC, Urine: NONE SEEN /hpf (ref 0–2)
WBC, UA: NONE SEEN /hpf (ref 0–5)

## 2019-01-19 LAB — URINE CULTURE: Organism ID, Bacteria: NO GROWTH

## 2019-02-03 IMAGING — DX DG LUMBAR SPINE COMPLETE 4+V
5 series · 5 of 5 positions shown · non-contrast
Comparison: None.

CLINICAL DATA: 72-year-old female with pain for the past month. No
injury. Initial encounter.

EXAM:
LUMBAR SPINE - COMPLETE 4+ VIEW

[l-spine ap]
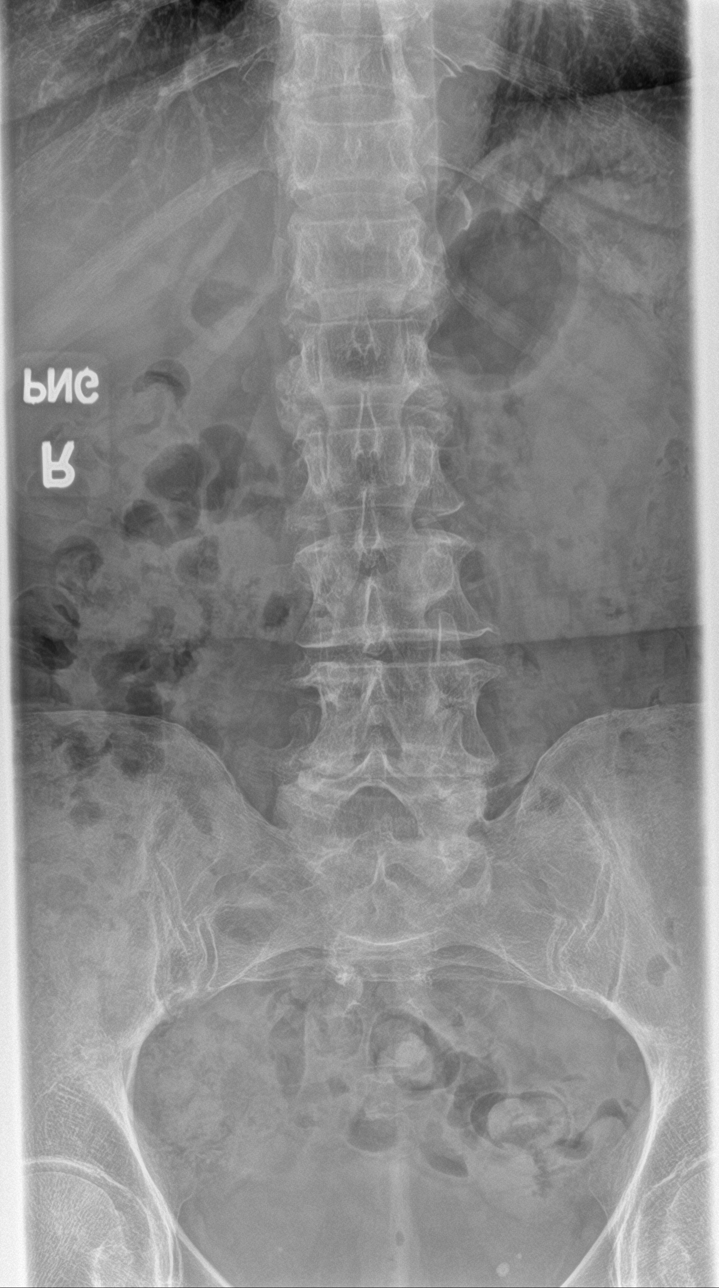

[l-spine obl (1 of 2)]
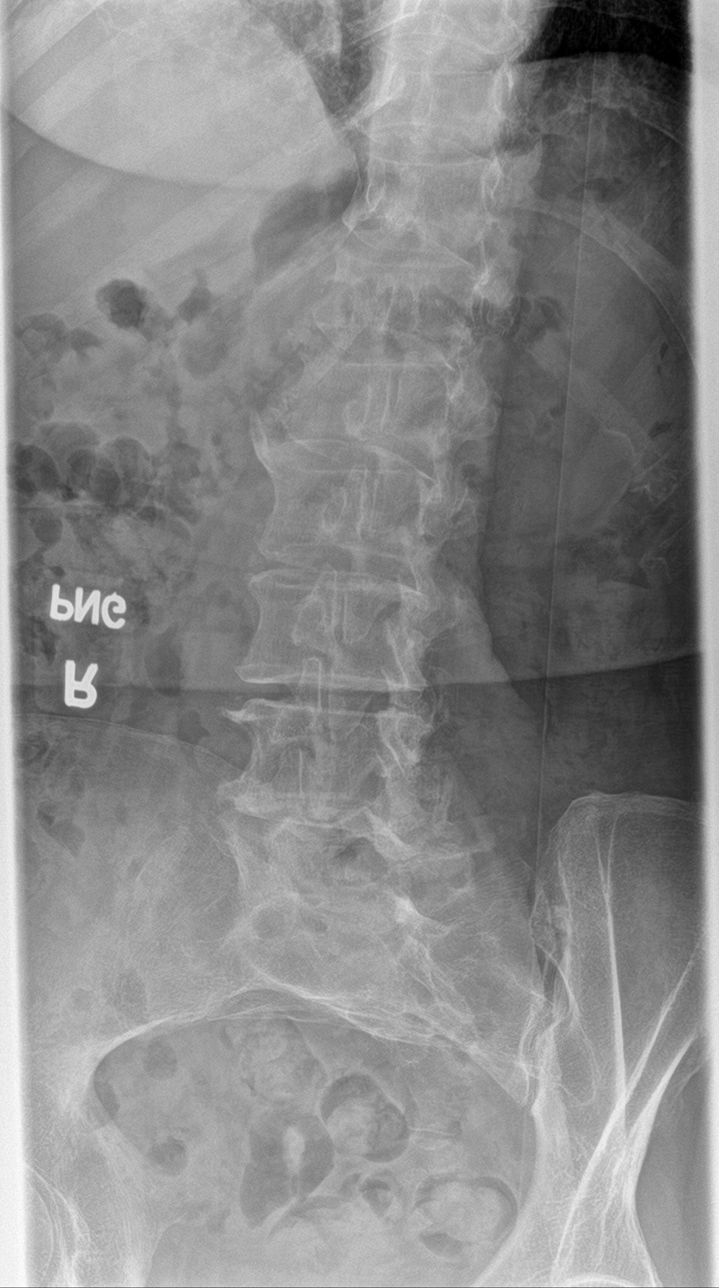

[l-spine obl (2 of 2)]
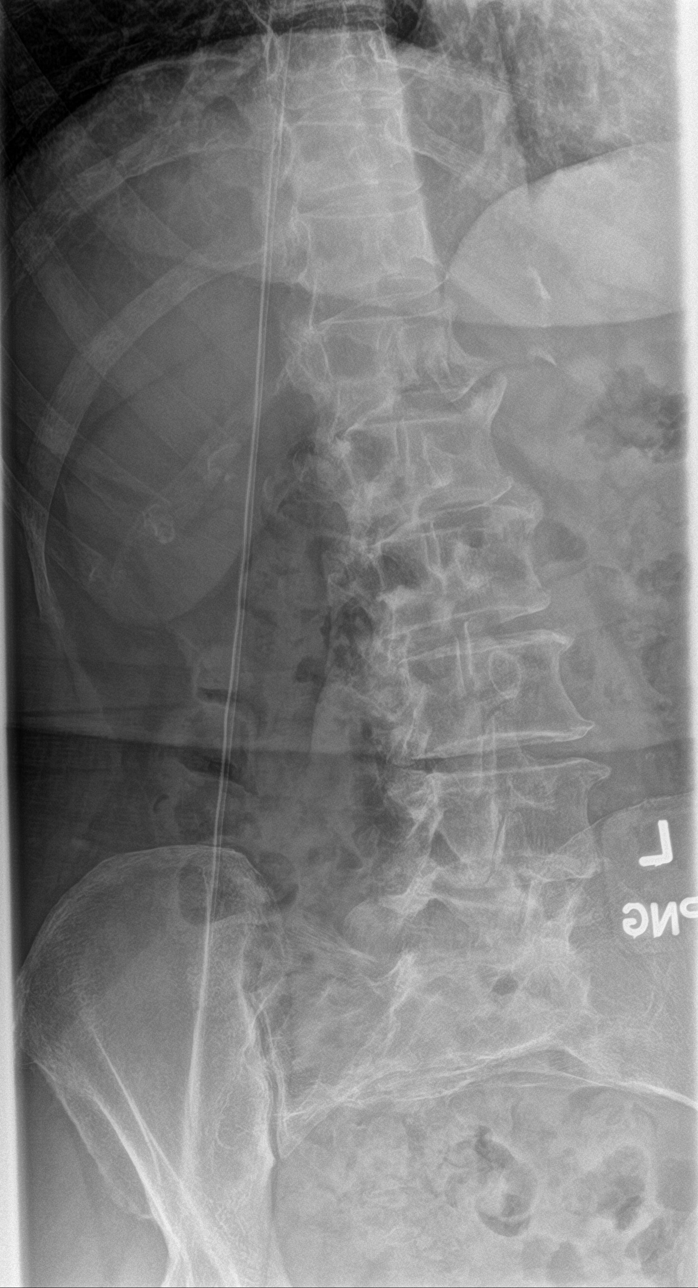

[l-spine lat]
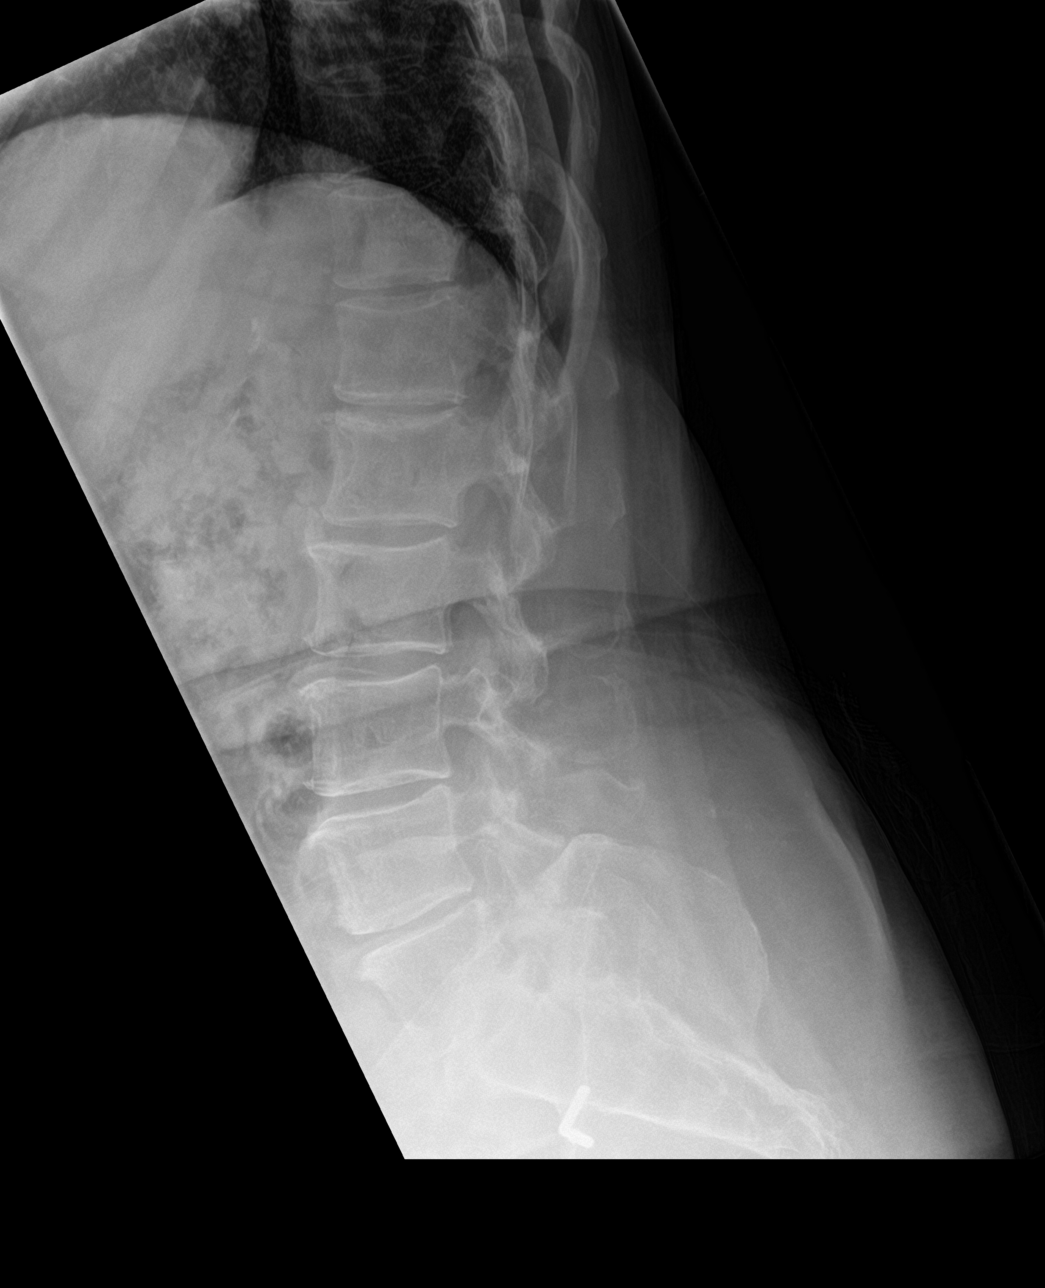

[l-spine spot]
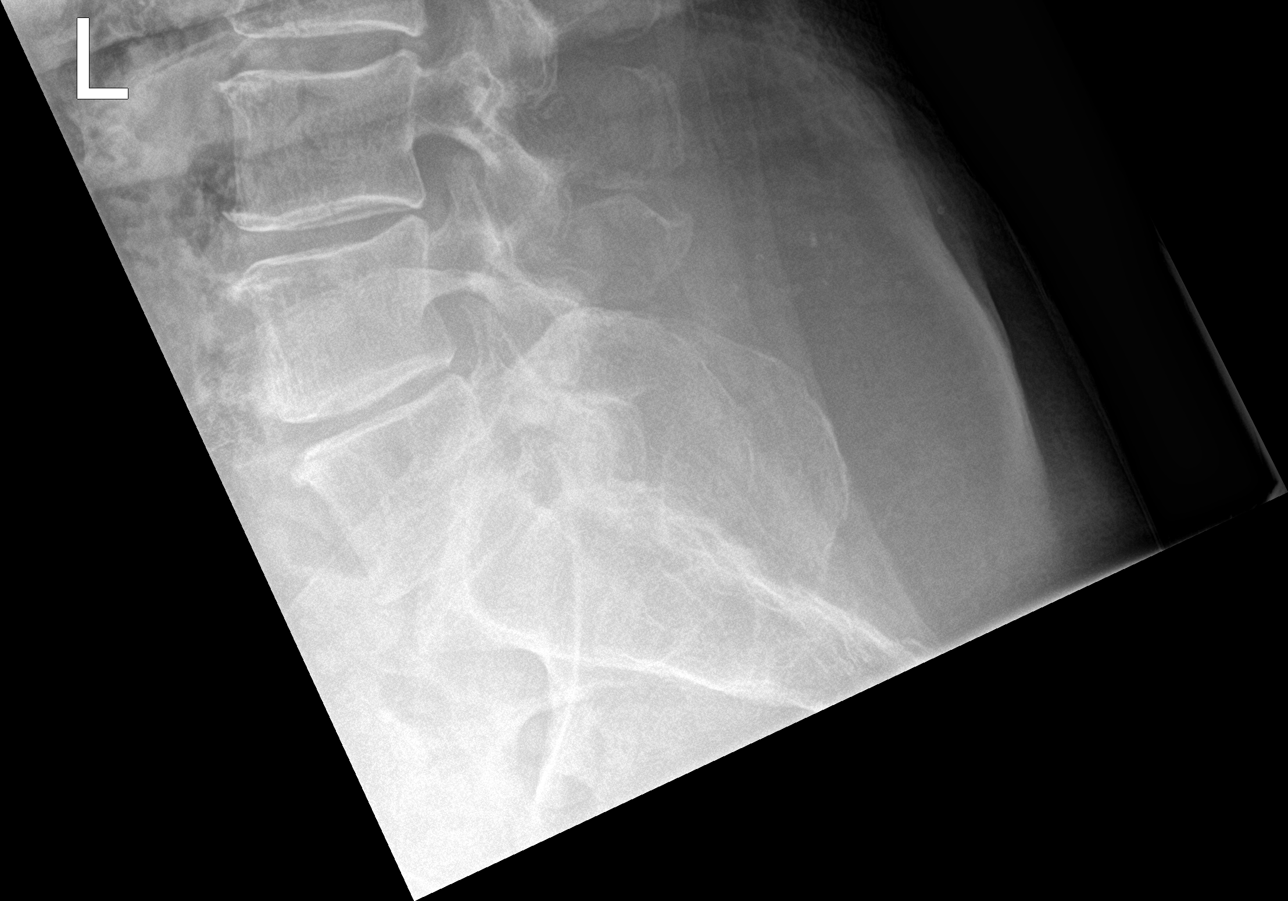

[5 of 5 positions shown; findings below may reference images not displayed]

FINDINGS: Minimal curvature lumbar spine. No compression fracture or pars
defect.

Mild disc space narrowing T12-L1, L1-2, L2-3, L3-4 and L5-S1. Mild
to moderate L4-5 disc space narrowing.
IMPRESSION: 1. Mild to moderate L4-5 disc space narrowing.
2. Mild disc space narrowing T12-L1, L1-2, L2-3, L3-4 and L5-S1.

## 2019-02-15 ENCOUNTER — Ambulatory Visit: Payer: Medicare Other | Admitting: Obstetrics & Gynecology

## 2019-03-08 ENCOUNTER — Other Ambulatory Visit: Payer: Self-pay

## 2019-03-12 ENCOUNTER — Ambulatory Visit (INDEPENDENT_AMBULATORY_CARE_PROVIDER_SITE_OTHER): Payer: Medicare Other | Admitting: Obstetrics & Gynecology

## 2019-03-12 ENCOUNTER — Encounter: Payer: Self-pay | Admitting: Obstetrics & Gynecology

## 2019-03-12 ENCOUNTER — Other Ambulatory Visit: Payer: Self-pay

## 2019-03-12 VITALS — BP 158/80 | HR 80 | Temp 97.1°F | Resp 12 | Ht 64.5 in | Wt 160.2 lb

## 2019-03-12 DIAGNOSIS — Z01419 Encounter for gynecological examination (general) (routine) without abnormal findings: Secondary | ICD-10-CM

## 2019-03-12 NOTE — Progress Notes (Signed)
74 y.o. [id number] Married White or Caucasian female here for annual exam.  Doing well.  Denies vaginal bleeding.  Still playing tennis.  Patient's last menstrual period was 04/27/1995.          Sexually active: No.  The current method of family planning is post menopausal status.    Exercising: Yes.    tennis, elliptical, walking Smoker:  no  Health Maintenance: Pap:  10/31/17 Neg, neg HR HPV  08/01/15 Neg              05/17/13 Neg History of abnormal Pap:  no MMG:  01/08/19 BIRADS 2 benign/density c Colonoscopy:  06/15/06 Normal. Cologuard 08/17/16 Neg BMD:   06/11/14 Normal  TDaP:  2013 Pneumonia vaccine(s):  2017 Shingrix:   Completed Hep C testing: 07/22/15 Neg Screening Labs: PCP   reports that she has never smoked. She has never used smokeless tobacco. She reports that she does not drink alcohol or use drugs.  Past Medical History:  Diagnosis Date  . History of blood transfusion    As a child from father  . Menometrorrhagia 05/1988    Past Surgical History:  Procedure Laterality Date  . COMBINED HYSTEROSCOPY DIAGNOSTIC / D&C  08/1989   Benign  . KNEE ARTHROSCOPY Right 01/2003  . KNEE ARTHROSCOPY Left 08/2003  . MANDIBLE SURGERY  06/1989   Reconstructive  . MOHS SURGERY  12/13   on forehead  . MOHS SURGERY Left 04/2016   face  . SHOULDER ARTHROSCOPY Left 03/1996    Current Outpatient Medications  Medication Sig Dispense Refill  . Cholecalciferol (VITAMIN D-3) 25 MCG (1000 UT) CAPS Take 2 capsules by mouth daily.    . Coenzyme Q10 (CO Q 10 PO) Take by mouth daily.    . Misc Natural Products (TART CHERRY ADVANCED PO) Take by mouth.    . Multiple Vitamin (MULTIVITAMIN) tablet Take 1 tablet by mouth daily.    . TURMERIC PO Take by mouth.     No current facility-administered medications for this visit.     Family History  Problem Relation Age of Onset  . Heart disease Mother   . Hypertension Mother   . Thyroid disease Mother   . Heart disease Father   . Epilepsy  Brother   . Heart disease Maternal Grandfather 108  . Heart disease Paternal Grandfather 55  . Other Sister        breast biopsy  . Osteoporosis Sister     Review of Systems  All other systems reviewed and are negative.   Exam:   BP (!) 158/80 (BP Location: Left Arm, Patient Position: Sitting, Cuff Size: Normal)   Pulse 80   Temp (!) 97.1 F (36.2 C) (Temporal)   Resp 12   Ht 5' 4.5" (1.638 m)   Wt 160 lb 3.2 oz (72.7 kg)   LMP 04/27/1995   BMI 27.07 kg/m     Height: 5' 4.5" (163.8 cm)  Ht Readings from Last 3 Encounters:  03/12/19 5' 4.5" (1.638 m)  03/06/18 5\' 5"  (1.651 m)  02/07/18 5\' 5"  (1.651 m)    General appearance: alert, cooperative and appears stated age Head: Normocephalic, without obvious abnormality, atraumatic Neck: no adenopathy, supple, symmetrical, trachea midline and thyroid normal to inspection and palpation Lungs: clear to auscultation bilaterally Breasts: normal appearance, no masses or tenderness Heart: regular rate and rhythm Abdomen: soft, non-tender; bowel sounds normal; no masses,  no organomegaly Extremities: extremities normal, atraumatic, no cyanosis or edema Skin: Skin color, texture, turgor  normal. No rashes or lesions Lymph nodes: Cervical, supraclavicular, and axillary nodes normal. No abnormal inguinal nodes palpated Neurologic: Grossly normal   Pelvic: External genitalia:  no lesions              Urethra:  normal appearing urethra with no masses, tenderness or lesions              Bartholins and Skenes: normal                 Vagina: normal appearing vagina with normal color and discharge, no lesions              Cervix: no lesions              Pap taken: No. Bimanual Exam:  Uterus:  normal size, contour, position, consistency, mobility, non-tender              Adnexa: normal adnexa and no mass, fullness, tenderness               Rectovaginal: Confirms               Anus:  normal sphincter tone, no lesions  Chaperone was present  for exam.  A:  Well Woman with normal exam PMP, no HRT Grade C breast density H/o SCC.  H/O Mohs surgery x 2.   H/o mildly elevated LDLs  P:   Mammogram guidelines reviewed.  MMG is UTD. pap smear neg 2019 Lab work not obtained today.  She will have this done with next appt with Dr. Sharlet Salina Vaccines are UTD Cologuard will be ordered in 07/2019 return annually or prn

## 2019-03-19 ENCOUNTER — Ambulatory Visit: Payer: Medicare Other | Admitting: Obstetrics & Gynecology

## 2019-05-07 ENCOUNTER — Ambulatory Visit: Payer: Medicare Other | Admitting: Family Medicine

## 2019-05-07 ENCOUNTER — Encounter: Payer: Self-pay | Admitting: Family Medicine

## 2019-05-07 ENCOUNTER — Ambulatory Visit (INDEPENDENT_AMBULATORY_CARE_PROVIDER_SITE_OTHER): Payer: Medicare PPO | Admitting: Family Medicine

## 2019-05-07 ENCOUNTER — Other Ambulatory Visit: Payer: Self-pay

## 2019-05-07 VITALS — BP 110/82 | HR 78 | Ht 64.5 in | Wt 165.0 lb

## 2019-05-07 DIAGNOSIS — G8929 Other chronic pain: Secondary | ICD-10-CM | POA: Diagnosis not present

## 2019-05-07 DIAGNOSIS — M2550 Pain in unspecified joint: Secondary | ICD-10-CM

## 2019-05-07 DIAGNOSIS — M545 Low back pain, unspecified: Secondary | ICD-10-CM

## 2019-05-07 MED ORDER — GABAPENTIN 100 MG PO CAPS: 200.0000 mg | ORAL_CAPSULE | Freq: Every day | ORAL | 0 refills | Status: DC

## 2019-05-07 NOTE — Assessment & Plan Note (Signed)
Has had back pain previously.  Has had pain that seems to be out of proportion.  Laboratory work-up was fairly unremarkable but patient is responding fairly well to the gabapentin.  We discussed with some of the nighttime pain about iron deficiency potentially playing a role as well.  Patient wants to continue with conservative therapy.  Discussed icing regimen and home exercises.  Worsening pain I would consider the possibility of duloxetine as a treatment option.  Patient is in agreement with the plan but would like to avoid taking the medicine on a daily basis if possible.  Follow-up again within 4 weeks spent  25 minutes with patient face-to-face and had greater than 50% of counseling including as described above in assessment and plan.

## 2019-05-07 NOTE — Patient Instructions (Signed)
Iron 65 mg Vitamin C 500mg  Gabapentin 200mg  at night for 10 nights Keep doing everything else If constipation Mirilax 17g and Colase 100mg  See me in 3-4 weeks

## 2019-05-07 NOTE — Progress Notes (Signed)
[facility] [address] [place] [address] Phone: [phone] Subjective:   Elizabeth Moses, am serving as a scribe for Dr. Antoine Primas. This visit occurred during the SARS-CoV-2 public health emergency.  Safety protocols were in place, including screening questions prior to the visit, additional usage of staff PPE, and extensive cleaning of exam room while observing appropriate contact time as indicated for disinfecting solutions.  :    CC: All over pain  [id number]  Elizabeth Moses is a 74 y.o. female coming in with complaint of lower back, hip, arm, chest, leg foot and toe pain. Pain can be intense depending on what she is doing. Yesterday did a low impact aerobic workout yesterday and is now having pain in right piriformis. States that since December 1st she has been in intense pain and also experiencing tingling. Took gabapentin in January and in one hour she felt better. Has been using gabapentin TID but still has an increase in pain near the end of her day.  Has been in years since we have seen patient.  Patient was doing much better for some time and then started having worsening pain again.  Patient does not know exactly what has changed.  Patient is able to still watch her 76-year-old great grandson as well as play tennis but notices more discomfort than usual.      Past Medical History:  Diagnosis Date  . History of blood transfusion    As a child from father  . Menometrorrhagia 05/1988   Past Surgical History:  Procedure Laterality Date  . COMBINED HYSTEROSCOPY DIAGNOSTIC / D&C  08/1989   Benign  . KNEE ARTHROSCOPY Right 01/2003  . KNEE ARTHROSCOPY Left 08/2003  . MANDIBLE SURGERY  06/1989   Reconstructive  . MOHS SURGERY  12/13   on forehead  . MOHS SURGERY Left 04/2016   face  . SHOULDER ARTHROSCOPY Left 03/1996   Social History   Socioeconomic History  . Marital status: Married    Spouse name: Not on file  .  Number of children: Not on file  . Years of education: Not on file  . Highest education level: Not on file  Occupational History  . Not on file  Tobacco Use  . Smoking status: Never Smoker  . Smokeless tobacco: Never Used  Substance and Sexual Activity  . Alcohol use: No  . Drug use: No  . Sexual activity: Not Currently    Partners: Male  Other Topics Concern  . Not on file  Social History Narrative  . Not on file   Social Determinants of Health   Financial Resource Strain:   . Difficulty of Paying Living Expenses: Not on file  Food Insecurity:   . Worried About Programme researcher, broadcasting/film/video in the Last Year: Not on file  . Ran Out of Food in the Last Year: Not on file  Transportation Needs:   . Lack of Transportation (Medical): Not on file  . Lack of Transportation (Non-Medical): Not on file  Physical Activity:   . Days of Exercise per Week: Not on file  . Minutes of Exercise per Session: Not on file  Stress:   . Feeling of Stress : Not on file  Social Connections:   . Frequency of Communication with Friends and Family: Not on file  . Frequency of Social Gatherings with Friends and Family: Not on file  . Attends Religious Services: Not on file  . Active Member of Clubs or  Organizations: Not on file  . Attends Archivist Meetings: Not on file  . Marital Status: Not on file   Allergies  Allergen Reactions  . Lodine [Etodolac] Rash  . Penicillins Rash   Family History  Problem Relation Age of Onset  . Heart disease Mother   . Hypertension Mother   . Thyroid disease Mother   . Heart disease Father   . Epilepsy Brother   . Heart disease Maternal Grandfather 90  . Heart disease Paternal Grandfather 23  . Other Sister        breast biopsy  . Osteoporosis Sister          Current Outpatient Medications (Other):  Marland Kitchen  Cholecalciferol (VITAMIN D-3) 25 MCG (1000 UT) CAPS, Take 2 capsules by mouth daily. .  Coenzyme Q10 (CO Q 10 PO), Take by mouth daily. .  Misc  Natural Products (TART CHERRY ADVANCED PO), Take by mouth. .  Multiple Vitamin (MULTIVITAMIN) tablet, Take 1 tablet by mouth daily. .  TURMERIC PO, Take by mouth.    Past medical history, social, surgical and family history all reviewed in electronic medical record.  No pertanent information unless stated regarding to the chief complaint.   Review of Systems:  No headache, visual changes, nausea, vomiting, diarrhea, constipation, dizziness, abdominal pain, skin rash, fevers, chills, night sweats, weight loss, swollen lymph nodes, body aches, joint swelling, muscle aches, chest pain, shortness of breath, mood changes.   Objective  Last menstrual period 04/27/1995. Systems examined below as of    General: No apparent distress alert and oriented x3 mood and affect normal, dressed appropriately.  HEENT: Pupils equal, extraocular movements intact  Respiratory: Patient's speak in full sentences and does not appear short of breath  Cardiovascular: No lower extremity edema, non tender, no erythema  Skin: Warm dry intact with no signs of infection or rash on extremities or on axial skeleton.  Abdomen: Soft nontender  Neuro: Cranial nerves II through XII are intact, neurovascularly intact in all extremities with 2+ DTRs and 2+ pulses.  Lymph: No lymphadenopathy of posterior or anterior cervical chain or axillae bilaterally.  Gait normal with good balance and coordination.  MSK: Arthritic changes of multiple joints back exam has some mild loss of lordosis with some very mild degenerative scoliosis.  Tightness of the right Corky Sox and pain in the right piriformis area.  Negative straight leg test.  Patient still pain in multiple different areas mostly muscular to even light sensation   Impression and Recommendations:     This case required medical decision making of moderate complexity. The above documentation has been reviewed and is accurate and complete Elizabeth Moses       Note: This dictation  was prepared with Dragon dictation along with smaller phrase technology. Any transcriptional errors that result from this process are unintentional.

## 2019-05-08 ENCOUNTER — Ambulatory Visit: Payer: Medicare PPO | Admitting: Family Medicine

## 2019-05-26 ENCOUNTER — Ambulatory Visit: Payer: Medicare PPO

## 2019-06-01 ENCOUNTER — Ambulatory Visit: Payer: Medicare PPO

## 2019-06-01 ENCOUNTER — Ambulatory Visit: Payer: Medicare PPO | Attending: Internal Medicine

## 2019-06-01 DIAGNOSIS — Z23 Encounter for immunization: Secondary | ICD-10-CM | POA: Insufficient documentation

## 2019-06-01 NOTE — Progress Notes (Signed)
   Covid-[facility] Vaccination [facility]  Name:  Elizabeth Moses    MRN: [id number] DOB: [birth year]  06/01/2019  [name] was observed post Covid-19 immunization for 15 minutes without incidence. She was provided with Vaccine Information Sheet and instruction to access the V-Safe system.   [name] was instructed to call 911 with any severe reactions post vaccine: Marland Kitchen Difficulty breathing  . Swelling of your face and throat  . A fast heartbeat  . A bad rash all over your body  . Dizziness and weakness    Immunizations Administered    Name Date Dose VIS Date Route   Pfizer COVID-19 Vaccine 06/01/2019  9:56 AM 0.3 mL 04/06/2019 Intramuscular   Manufacturer: [employer], [employer]   Lot: [id number]   NDC: [address]-4

## 2019-06-05 ENCOUNTER — Other Ambulatory Visit: Payer: Self-pay

## 2019-06-05 ENCOUNTER — Ambulatory Visit (INDEPENDENT_AMBULATORY_CARE_PROVIDER_SITE_OTHER): Payer: Medicare PPO | Admitting: Family Medicine

## 2019-06-05 ENCOUNTER — Encounter: Payer: Self-pay | Admitting: Family Medicine

## 2019-06-05 DIAGNOSIS — M545 Low back pain, unspecified: Secondary | ICD-10-CM

## 2019-06-05 DIAGNOSIS — G8929 Other chronic pain: Secondary | ICD-10-CM

## 2019-06-05 NOTE — Progress Notes (Signed)
[facility] [address] [place] [address] Phone: [phone] Subjective:   Bruce Donath, am serving as a scribe for Dr. Antoine Primas. This visit occurred during the SARS-CoV-2 public health emergency.  Safety protocols were in place, including screening questions prior to the visit, additional usage of staff PPE, and extensive cleaning of exam room while observing appropriate contact time as indicated for disinfecting solutions.   I'm seeing this patient by the request  of:  Myrlene Broker, MD  CC: Low back pain follow-up  QIH:[id number]   05/07/2019 Has had back pain previously.  Has had pain that seems to be out of proportion.  Laboratory work-up was fairly unremarkable but patient is responding fairly well to the gabapentin.  We discussed with some of the nighttime pain about iron deficiency potentially playing a role as well.  Patient wants to continue with conservative therapy.  Discussed icing regimen and home exercises.  Worsening pain I would consider the possibility of duloxetine as a treatment option.  Patient is in agreement with the plan but would like to avoid taking the medicine on a daily basis if possible.  Follow-up again within 4 weeks spent  25 minutes with patient face-to-face and had greater than 50% of counseling including as described above in assessment and plan.  Update 06/05/2019 Elizabeth Moses is a 74 y.o. female coming in with complaint of low back pain. Patient has been non-compliant with gabapentin recommendation. Patient states she was using gabapentin 200mg  at night and felt relief. Stopped taking them and was fine for 2 weeks. Played tennis and pain returned. Started 200mg  gabapentin again and then discontinued them as she felt fine a few days later. Used elliptical and pain came back. Went back on gabapentin which helped to alleviate her pain. Has numbness in her 4th and 5th toes in both feet. Numbness went away  when she took gabapentin. Patient also notes hot flashes that have been occurring recently. Overall, patient feels better but she likes to be active and her pain always returns with activity. Wants to know how long she can take gabapentin and if it has the potential to make one have crazy dreams.     Past Medical History:  Diagnosis Date  . History of blood transfusion    As a child from father  . Menometrorrhagia 05/1988   Past Surgical History:  Procedure Laterality Date  . COMBINED HYSTEROSCOPY DIAGNOSTIC / D&C  08/1989   Benign  . KNEE ARTHROSCOPY Right 01/2003  . KNEE ARTHROSCOPY Left 08/2003  . MANDIBLE SURGERY  06/1989   Reconstructive  . MOHS SURGERY  12/13   on forehead  . MOHS SURGERY Left 04/2016   face  . SHOULDER ARTHROSCOPY Left 03/1996   Social History   Socioeconomic History  . Marital status: Married    Spouse name: Not on file  . Number of children: Not on file  . Years of education: Not on file  . Highest education level: Not on file  Occupational History  . Not on file  Tobacco Use  . Smoking status: Never Smoker  . Smokeless tobacco: Never Used  Substance and Sexual Activity  . Alcohol use: No  . Drug use: No  . Sexual activity: Not Currently    Partners: Male  Other Topics Concern  . Not on file  Social History Narrative  . Not on file   Social Determinants of Health   Financial Resource Strain:   .  Difficulty of Paying Living Expenses: Not on file  Food Insecurity:   . Worried About Programme researcher, broadcasting/film/video in the Last Year: Not on file  . Ran Out of Food in the Last Year: Not on file  Transportation Needs:   . Lack of Transportation (Medical): Not on file  . Lack of Transportation (Non-Medical): Not on file  Physical Activity:   . Days of Exercise per Week: Not on file  . Minutes of Exercise per Session: Not on file  Stress:   . Feeling of Stress : Not on file  Social Connections:   . Frequency of Communication with Friends and Family:  Not on file  . Frequency of Social Gatherings with Friends and Family: Not on file  . Attends Religious Services: Not on file  . Active Member of Clubs or Organizations: Not on file  . Attends Banker Meetings: Not on file  . Marital Status: Not on file   Allergies  Allergen Reactions  . Lodine [Etodolac] Rash  . Penicillins Rash   Family History  Problem Relation Age of Onset  . Heart disease Mother   . Hypertension Mother   . Thyroid disease Mother   . Heart disease Father   . Epilepsy Brother   . Heart disease Maternal Grandfather 35  . Heart disease Paternal Grandfather 59  . Other Sister        breast biopsy  . Osteoporosis Sister          Current Outpatient Medications (Other):  Marland Kitchen  Cholecalciferol (VITAMIN D-3) 25 MCG (1000 UT) CAPS, Take 2 capsules by mouth daily. .  Coenzyme Q10 (CO Q 10 PO), Take by mouth daily. Marland Kitchen  gabapentin (NEURONTIN) 100 MG capsule, Take 2 capsules (200 mg total) by mouth at bedtime. .  Misc Natural Products (TART CHERRY ADVANCED PO), Take by mouth. .  Multiple Vitamin (MULTIVITAMIN) tablet, Take 1 tablet by mouth daily. .  TURMERIC PO, Take by mouth.   Reviewed prior external information including notes and imaging from  primary care provider As well as notes that were available from care everywhere and other healthcare systems.  Past medical history, social, surgical and family history all reviewed in electronic medical record.  No pertanent information unless stated regarding to the chief complaint.   Review of Systems:  No headache, visual changes, nausea, vomiting, diarrhea, constipation, dizziness, abdominal pain, skin rash, fevers, chills, night sweats, weight loss, swollen lymph nodes,  joint swelling, chest pain, shortness of breath, mood changes. POSITIVE muscle aches, body aches  Objective  Blood pressure 132/88, pulse 81, height 5' 4.5" (1.638 m), weight 163 lb (73.9 kg), last menstrual period 04/27/1995, SpO2 97  %.   General: No apparent distress alert and oriented x3 mood and affect normal, dressed appropriately.  HEENT: Pupils equal, extraocular movements intact  Respiratory: Patient's speak in full sentences and does not appear short of breath  Cardiovascular: No lower extremity edema, non tender, no erythema  Skin: Warm dry intact with no signs of infection or rash on extremities or on axial skeleton.  Abdomen: Soft nontender  Neuro: Cranial nerves II through XII are intact, neurovascularly intact in all extremities with 2+ DTRs and 2+ pulses.  Lymph: No lymphadenopathy of posterior or anterior cervical chain or axillae bilaterally.  Gait normal with good balance and coordination.  MSK: Arthritic changes of multiple joints Back exam does have some loss of lordosis and some degenerative scoliosis noted.  Tenderness noted more on the paraspinal musculature  of the thoracolumbar juncture left greater than right.  No rash noted at the moment.  Negative straight leg test but tightness of the hamstring noted.  Negative FABER test.    Impression and Recommendations:     The above documentation has been reviewed and is accurate and complete Elizabeth Pulley, DO       Note: This dictation was prepared with Dragon dictation along with smaller phrase technology. Any transcriptional errors that result from this process are unintentional.

## 2019-06-05 NOTE — Patient Instructions (Addendum)
Arnica lotion 100 mg of gabapentin nightly, can increase to 200mg  as needed Continue exercises Send my chart message in 4 weeks

## 2019-06-05 NOTE — Assessment & Plan Note (Signed)
Patient seems to be doing better.  Patient is almost 3 no pain when she takes the gabapentin on a regular basis but does not take it on a regular basis.  Encouraged her to at least take it at night.  Patient will consider it.  We discussed icing regimen and home exercises, we discussed other ergonomics that I think can be beneficial and help.  Follow-up again in 4 to 8 weeks

## 2019-06-26 ENCOUNTER — Ambulatory Visit: Payer: Medicare PPO | Attending: Internal Medicine

## 2019-06-26 DIAGNOSIS — Z23 Encounter for immunization: Secondary | ICD-10-CM | POA: Insufficient documentation

## 2019-06-26 NOTE — Progress Notes (Signed)
   Covid-19 Vaccination Clinic  Name:  IRIE DOWSON    MRN: [id number] DOB: 03-08-1946  06/26/2019  Ms. Ellsworth was observed post Covid-19 immunization for 15 minutes without incident. She was provided with Vaccine Information Sheet and instruction to access the V-Safe system.   Ms. Hashman was instructed to call 911 with any severe reactions post vaccine: Marland Kitchen Difficulty breathing  . Swelling of face and throat  . A fast heartbeat  . A bad rash all over body  . Dizziness and weakness   Immunizations Administered    Name Date Dose VIS Date Route   Pfizer COVID-19 Vaccine 06/26/2019  9:27 AM 0.3 mL 04/06/2019 Intramuscular   Manufacturer: [employer], [employer]   Lot: [id number]   NDC: [address]-0

## 2019-07-03 ENCOUNTER — Encounter: Payer: Self-pay | Admitting: Family Medicine

## 2019-07-17 ENCOUNTER — Encounter: Payer: Self-pay | Admitting: Certified Nurse Midwife

## 2019-09-14 ENCOUNTER — Telehealth: Payer: Self-pay | Admitting: *Deleted

## 2019-09-14 NOTE — Telephone Encounter (Signed)
-----   Message from Jerene Bears, MD sent at 09/10/2019  4:28 AM EDT ----- Regarding: cologuard Could you please call this pt and make sure she I ok with doing another cologuard?  It is due now.  If so, will you start the paperwork for it?  Thanks.  Rosalita Chessman

## 2019-09-14 NOTE — Telephone Encounter (Signed)
Call to patient. Message given to patient as seen below from Dr. Hyacinth Meeker and patient verbalized understanding. Patient agreeable to proceed with cologuard. RN advised would fill out paperwork for Dr. Hyacinth Meeker to sign to be faxed to [facility]. Patient agreeable.   Routing to provider and will close encounter.

## 2019-10-08 ENCOUNTER — Telehealth: Payer: Self-pay | Admitting: *Deleted

## 2019-10-08 NOTE — Telephone Encounter (Signed)
Patient calling for cologuard results. 

## 2019-10-08 NOTE — Telephone Encounter (Signed)
Received fax from [employer] with results. Will place on Dr Rondel Baton desk for review.  Routing to Dr Hyacinth Meeker.

## 2019-10-08 NOTE — Telephone Encounter (Signed)
Spoke with pt. Pt states was told Cologard results are back, but needed to get results from Dr Hyacinth Meeker.  Advised will speak with [employer] about results and get them faxed today.   Spoke with Koren Bound at [facility] and given fax number of office today. States results negative.

## 2019-10-09 ENCOUNTER — Telehealth: Payer: Self-pay | Admitting: Obstetrics & Gynecology

## 2019-10-09 ENCOUNTER — Other Ambulatory Visit: Payer: Self-pay

## 2019-10-09 DIAGNOSIS — Z1211 Encounter for screening for malignant neoplasm of colon: Secondary | ICD-10-CM

## 2019-10-09 DIAGNOSIS — Z1212 Encounter for screening for malignant neoplasm of rectum: Secondary | ICD-10-CM

## 2019-10-09 LAB — COLOGUARD: Cologuard: NEGATIVE

## 2019-10-09 NOTE — Telephone Encounter (Signed)
Will close this encounter.  See phone encounter dated on 10/08/19.

## 2019-10-09 NOTE — Telephone Encounter (Signed)
Left detailed message per DPR with negative results. Pt to return call to office with any questions or concerns.  Routing to Dr Hyacinth Meeker  Encounter closed.

## 2019-10-09 NOTE — Telephone Encounter (Signed)
Please let pt know this was negative.  It will be scanned into Epic for documentation.  Thanks.

## 2019-10-09 NOTE — Telephone Encounter (Signed)
Patient returned a call to Stephanie. 

## 2019-11-19 ENCOUNTER — Other Ambulatory Visit: Payer: Self-pay

## 2019-11-19 ENCOUNTER — Encounter: Payer: Self-pay | Admitting: Obstetrics & Gynecology

## 2019-11-19 ENCOUNTER — Telehealth: Payer: Self-pay

## 2019-11-19 ENCOUNTER — Ambulatory Visit: Payer: Medicare PPO | Admitting: Obstetrics & Gynecology

## 2019-11-19 VITALS — BP 130/80 | HR 68 | Resp 16 | Wt 166.0 lb

## 2019-11-19 DIAGNOSIS — R300 Dysuria: Secondary | ICD-10-CM

## 2019-11-19 MED ORDER — ESTRADIOL 0.1 MG/GM VA CREA: TOPICAL_CREAM | VAGINAL | 1 refills | Status: DC

## 2019-11-19 MED ORDER — NITROFURANTOIN MONOHYD MACRO 100 MG PO CAPS: 100.0000 mg | ORAL_CAPSULE | Freq: Two times a day (BID) | ORAL | 0 refills | Status: DC

## 2019-11-19 NOTE — Telephone Encounter (Signed)
Patient is calling in regards to UTI. Offered patient next available, (11/21/19) with Dr. Oscar La, patient is okay with this. Patient stated she would prefer to see Dr. Hyacinth Meeker today.

## 2019-11-19 NOTE — Telephone Encounter (Signed)
Spoke with patient. Requesting earlier appt with Dr. Hyacinth Meeker for UTI symptoms. Reports intermittent burning and pressure that started 2 wks, has become constant since 7/25. She had an old partial Rx for macrobid, took this sometime last week, improved symptoms for a few days. Took Rx pyridium x1 last night for symptom relief. Denies flank pain, hematuria, fever/chills, N/V. OV scheduled for today at 1:15pm with Dr. Hyacinth Meeker, patient is aware she is being worked into the schedule. OV for 7/28 cancelled w/ Dr. Oscar La.   Routing to provider for final review. Patient is agreeable to disposition. Will close encounter.

## 2019-11-19 NOTE — Progress Notes (Signed)
GYNECOLOGY  VISIT  CC:   dysuria  HPI: 74 y.o. [id number] Married White or Caucasian female here for dysuria that pt reports occurred about 3 weeks ago.  It lasted just a short amount of time.  Then last week, it started again.  She had some "left over" nitrofurantoin that she took on Wednesday and Thursday.  She also had some phenazopyridine 100mg  and she took this for the past three days.  She is playing a lot of tennis right now.  Denies back pain.  Urine is very orange so she cannot tell if there was any blood in her urine.  She denies fever as well.  She is feeling the sensation of need to go more frequently and having some urinary urgency.  Denies vaginal bleeding.    GYNECOLOGIC HISTORY: Patient's last menstrual period was 04/27/1995. Contraception: post menopausal Menopausal hormone therapy: none  Patient Active Problem List   Diagnosis Date Noted  . Polyarthralgia 02/07/2018  . Low back pain 02/07/2018  . Acute left-sided thoracic back pain 11/30/2016  . Atypical chest pain 02/14/2015  . White coat hypertension 07/18/2014  . Routine general medical examination at a health care facility 07/18/2014    Past Medical History:  Diagnosis Date  . History of blood transfusion    As a child from father  . Menometrorrhagia 05/1988    Past Surgical History:  Procedure Laterality Date  . COMBINED HYSTEROSCOPY DIAGNOSTIC / D&C  08/1989   Benign  . KNEE ARTHROSCOPY Right 01/2003  . KNEE ARTHROSCOPY Left 08/2003  . MANDIBLE SURGERY  06/1989   Reconstructive  . MOHS SURGERY  12/13   on forehead  . MOHS SURGERY Left 04/2016   face  . SHOULDER ARTHROSCOPY Left 03/1996    MEDS:   Current Outpatient Medications on File Prior to Visit  Medication Sig Dispense Refill  . Cholecalciferol (VITAMIN D-3) 25 MCG (1000 UT) CAPS Take 1 capsule by mouth daily.     . Coenzyme Q10 (CO Q 10 PO) Take by mouth.     . fluorouracil (EFUDEX) 5 % cream     . Misc Natural Products (TART CHERRY  ADVANCED PO) Take by mouth.    . Multiple Vitamin (MULTIVITAMIN PO) Take by mouth. 2 gummies    . TURMERIC PO Take by mouth.     No current facility-administered medications on file prior to visit.    ALLERGIES: Lodine [etodolac] and Penicillins  Family History  Problem Relation Age of Onset  . Heart disease Mother   . Hypertension Mother   . Thyroid disease Mother   . Heart disease Father   . Epilepsy Brother   . Heart disease Maternal Grandfather 41  . Heart disease Paternal Grandfather 72  . Other Sister        breast biopsy  . Osteoporosis Sister     SH:  Married, non smoker  Review of Systems  Constitutional: Negative.   HENT: Negative.   Eyes: Negative.   Respiratory: Negative.   Cardiovascular: Negative.   Gastrointestinal: Negative.   Endocrine: Negative.   Genitourinary: Positive for frequency.       Pain & burning with urination  Musculoskeletal: Negative.   Skin: Negative.   Allergic/Immunologic: Negative.   Neurological: Negative.   Hematological: Negative.   Psychiatric/Behavioral: Negative.     PHYSICAL EXAMINATION:    BP (!) 130/80   Pulse 68   Resp 16   Wt 166 lb (75.3 kg)   LMP 04/27/1995   BMI 28.05 kg/m  General appearance: alert, cooperative and appears stated age Flank:  No CVA tenderness Abdomen: soft, mild suprapubic tenderness, bowel sounds normal; no masses,  no organomegaly Lymph:  no inguinal LAD noted  Pelvic: External genitalia:  no lesions              Urethra:  normal appearing urethra with no masses, tenderness or lesions              Bartholins and Skenes: normal                 Vagina: normal appearing vagina with normal color and discharge, no lesions              Cervix: no lesions              Bimanual Exam:  Uterus:  normal size, contour, position, consistency, mobility, non-tender              Adnexa: no mass, fullness, tenderness  Chaperone, Zenovia Jordan, CMA, was present for exam.  Assessment: Urinary  urgency (has been on macrobid for 2 days) Vulvar atrophic changes  Plan: Urine culture pending Rx for macrobid 100mg  bid x 5 days Discussed with pt starting topical vaginal estrogen cream externally about two times a week.  Rx for estrace vaginal cream to pharmacy.

## 2019-11-21 ENCOUNTER — Ambulatory Visit: Payer: Medicare Other | Admitting: Obstetrics and Gynecology

## 2019-11-21 LAB — URINE CULTURE: Organism ID, Bacteria: NO GROWTH

## 2019-11-27 ENCOUNTER — Encounter: Payer: Self-pay | Admitting: Obstetrics & Gynecology

## 2019-11-27 LAB — COLOGUARD

## 2019-12-03 ENCOUNTER — Other Ambulatory Visit: Payer: Self-pay

## 2019-12-03 ENCOUNTER — Encounter: Payer: Self-pay | Admitting: Obstetrics and Gynecology

## 2019-12-03 ENCOUNTER — Ambulatory Visit: Payer: Medicare PPO | Admitting: Obstetrics and Gynecology

## 2019-12-03 ENCOUNTER — Telehealth: Payer: Self-pay

## 2019-12-03 VITALS — BP 130/78 | HR 77 | Ht 64.5 in | Wt 164.0 lb

## 2019-12-03 DIAGNOSIS — N949 Unspecified condition associated with female genital organs and menstrual cycle: Secondary | ICD-10-CM | POA: Diagnosis not present

## 2019-12-03 DIAGNOSIS — N952 Postmenopausal atrophic vaginitis: Secondary | ICD-10-CM

## 2019-12-03 DIAGNOSIS — R102 Pelvic and perineal pain: Secondary | ICD-10-CM | POA: Diagnosis not present

## 2019-12-03 DIAGNOSIS — R350 Frequency of micturition: Secondary | ICD-10-CM

## 2019-12-03 LAB — POCT URINALYSIS DIPSTICK
Bilirubin, UA: NEGATIVE
Glucose, UA: NEGATIVE
Ketones, UA: NEGATIVE
Nitrite, UA: NEGATIVE
Protein, UA: NEGATIVE
Urobilinogen, UA: NEGATIVE E.U./dL — AB
pH, UA: 6 (ref 5.0–8.0)

## 2019-12-03 MED ORDER — SULFAMETHOXAZOLE-TRIMETHOPRIM 800-160 MG PO TABS
1.0000 | ORAL_TABLET | Freq: Two times a day (BID) | ORAL | 0 refills | Status: DC
Start: 2019-12-03 — End: 2020-01-22

## 2019-12-03 MED ORDER — PHENAZOPYRIDINE HCL 200 MG PO TABS
200.0000 mg | ORAL_TABLET | Freq: Three times a day (TID) | ORAL | 0 refills | Status: DC | PRN
Start: 2019-12-03 — End: 2020-02-25

## 2019-12-03 NOTE — Telephone Encounter (Signed)
Spoke with patient. Tx for UTI on 11/19/19 w/ Macrobid 100 mg bid x5 days. Urine culture negative, patient had been on abx 2 days prior to culture.   Symptoms initially resolved, returned on 8/5. Reports dysuria and pressure. Took pyridium x1 on 8/8, no change in symptoms. Denies any other symptoms.   Advised OV needed for further evaluation, patient agreeable to schedule with covering provider. OV scheduled for today at 4:15pm with Dr. Oscar La.   Encounter closed.

## 2019-12-03 NOTE — Patient Instructions (Signed)
Atrophic Vaginitis  Atrophic vaginitis is a condition in which the tissues that line the vagina become dry and thin. This condition is most common in women who have stopped having regular menstrual periods (are in menopause). This usually starts when a woman is 45-76 years old. That is the time when a woman's estrogen levels begin to drop (decrease). Estrogen is a female hormone. It helps to keep the tissues of the vagina moist. It stimulates the vagina to produce a clear fluid that lubricates the vagina for sexual intercourse. This fluid also protects the vagina from infection. Lack of estrogen can cause the lining of the vagina to get thinner and dryer. The vagina may also shrink in size. It may become less elastic. Atrophic vaginitis tends to get worse over time as a woman's estrogen level drops. What are the causes? This condition is caused by the normal drop in estrogen that happens around the time of menopause. What increases the risk? Certain conditions or situations may lower a woman's estrogen level, leading to a higher risk for atrophic vaginitis. You are more likely to develop this condition if:  You are taking medicines that block estrogen.  You have had your ovaries removed.  You are being treated for cancer with X-ray (radiation) or medicines (chemotherapy).  You have given birth or are breastfeeding.  You are older than age 50.  You smoke. What are the signs or symptoms? Symptoms of this condition include:  Pain, soreness, or bleeding during sexual intercourse (dyspareunia).  Vaginal burning, irritation, or itching.  Pain or bleeding when a speculum is used in a vaginal exam (pelvic exam).  Having burning pain when passing urine.  Vaginal discharge that is brown or yellow. In some cases, there are no symptoms. How is this diagnosed? This condition is diagnosed by taking a medical history and doing a physical exam. This will include a pelvic exam that checks the  vaginal tissues. Though rare, you may also have other tests, including:  A urine test.  A test that checks the acid balance in your vagina (acid balance test). How is this treated? Treatment for this condition depends on how severe your symptoms are. Treatment may include:  Using an over-the-counter vaginal lubricant before sex.  Using a long-acting vaginal moisturizer.  Using low-dose vaginal estrogen for moderate to severe symptoms that do not respond to other treatments. Options include creams, tablets, and inserts (vaginal rings). Before you use a vaginal estrogen, tell your health care provider if you have a history of: ? Breast cancer. ? Endometrial cancer. ? Blood clots. If you are not sexually active and your symptoms are very mild, you may not need treatment. Follow these instructions at home: Medicines  Take over-the-counter and prescription medicines only as told by your health care provider. Do not use herbal or alternative medicines unless your health care provider says that you can.  Use over-the-counter creams, lubricants, or moisturizers for dryness only as directed by your health care provider. General instructions  If your atrophic vaginitis is caused by menopause, discuss all of your menopause symptoms and treatment options with your health care provider.  Do not douche.  Do not use products that can make your vagina dry. These include: ? Scented feminine sprays. ? Scented tampons. ? Scented soaps.  Vaginal intercourse can help to improve blood flow and elasticity of vaginal tissue. If it hurts to have sex, try using a lubricant or moisturizer just before having intercourse. Contact a health care provider if:    Your discharge looks different than normal.  Your vagina has an unusual smell.  You have new symptoms.  Your symptoms do not improve with treatment.  Your symptoms get worse. Summary  Atrophic vaginitis is a condition in which the tissues that  line the vagina become dry and thin. It is most common in women who have stopped having regular menstrual periods (are in menopause).  Treatment options include using vaginal lubricants and low-dose vaginal estrogen.  Contact a health care provider if your vagina has an unusual smell, or if your symptoms get worse or do not improve after treatment. This information is not intended to replace advice given to you by your health care provider. Make sure you discuss any questions you have with your health care provider. Document Revised: 03/25/2017 Document Reviewed: 01/06/2017 Elsevier Patient Education  2020 [employer] Urinary Tract Infection, Adult  A urinary tract infection (UTI) is an infection of any part of the urinary tract. The urinary tract includes the kidneys, ureters, bladder, and urethra. These organs make, store, and get rid of urine in the body. Your health care provider may use other names to describe the infection. An upper UTI affects the ureters and kidneys (pyelonephritis). A lower UTI affects the bladder (cystitis) and urethra (urethritis). What are the causes? Most urinary tract infections are caused by bacteria in your genital area, around the entrance to your urinary tract (urethra). These bacteria grow and cause inflammation of your urinary tract. What increases the risk? You are more likely to develop this condition if:  You have a urinary catheter that stays in place (indwelling).  You are not able to control when you urinate or have a bowel movement (you have incontinence).  You are female and you: ? Use a spermicide or diaphragm for birth control. ? Have low estrogen levels. ? Are pregnant.  You have certain genes that increase your risk (genetics).  You are sexually active.  You take antibiotic medicines.  You have a condition that causes your flow of urine to slow down, such as: ? An enlarged prostate, if you are female. ? Blockage in your urethra  (stricture). ? A kidney stone. ? A nerve condition that affects your bladder control (neurogenic bladder). ? Not getting enough to drink, or not urinating often.  You have certain medical conditions, such as: ? Diabetes. ? A weak disease-fighting system (immunesystem). ? Sickle cell disease. ? Gout. ? Spinal cord injury. What are the signs or symptoms? Symptoms of this condition include:  Needing to urinate right away (urgently).  Frequent urination or passing small amounts of urine frequently.  Pain or burning with urination.  Blood in the urine.  Urine that smells bad or unusual.  Trouble urinating.  Cloudy urine.  Vaginal discharge, if you are female.  Pain in the abdomen or the lower back. You may also have:  Vomiting or a decreased appetite.  Confusion.  Irritability or tiredness.  A fever.  Diarrhea. The first symptom in older adults may be confusion. In some cases, they may not have any symptoms until the infection has worsened. How is this diagnosed? This condition is diagnosed based on your medical history and a physical exam. You may also have other tests, including:  Urine tests.  Blood tests.  Tests for sexually transmitted infections (STIs). If you have had more than one UTI, a cystoscopy or imaging studies may be done to determine the cause of the infections. How is this treated? Treatment for this condition includes:  Antibiotic medicine.  Over-the-counter medicines to treat discomfort.  Drinking enough water to stay hydrated. If you have frequent infections or have other conditions such as a kidney stone, you may need to see a health care provider who specializes in the urinary tract (urologist). In rare cases, urinary tract infections can cause sepsis. Sepsis is a life-threatening condition that occurs when the body responds to an infection. Sepsis is treated in the hospital with IV antibiotics, fluids, and other medicines. Follow these  instructions at home:  Medicines  Take over-the-counter and prescription medicines only as told by your health care provider.  If you were prescribed an antibiotic medicine, take it as told by your health care provider. Do not stop using the antibiotic even if you start to feel better. General instructions  Make sure you: ? Empty your bladder often and completely. Do not hold urine for long periods of time. ? Empty your bladder after sex. ? Wipe from front to back after a bowel movement if you are female. Use each tissue one time when you wipe.  Drink enough fluid to keep your urine pale yellow.  Keep all follow-up visits as told by your health care provider. This is important. Contact a health care provider if:  Your symptoms do not get better after 1-2 days.  Your symptoms go away and then return. Get help right away if you have:  Severe pain in your back or your lower abdomen.  A fever.  Nausea or vomiting. Summary  A urinary tract infection (UTI) is an infection of any part of the urinary tract, which includes the kidneys, ureters, bladder, and urethra.  Most urinary tract infections are caused by bacteria in your genital area, around the entrance to your urinary tract (urethra).  Treatment for this condition often includes antibiotic medicines.  If you were prescribed an antibiotic medicine, take it as told by your health care provider. Do not stop using the antibiotic even if you start to feel better.  Keep all follow-up visits as told by your health care provider. This is important. This information is not intended to replace advice given to you by your health care provider. Make sure you discuss any questions you have with your health care provider. Document Revised: 03/30/2018 Document Reviewed: 10/20/2017 Elsevier Patient Education  2020 [employer].

## 2019-12-03 NOTE — Telephone Encounter (Signed)
Opened in error

## 2019-12-03 NOTE — Progress Notes (Signed)
GYNECOLOGY  VISIT   HPI: 74 y.o.   Married White or Caucasian Not Hispanic or Latino  female   [phone] with Patient's last menstrual period was 04/27/1995.   here for UTI symptoms. She states that she was seen two weeks ago and treated for a UTI . The urine culture was negative for infection, but she started the antibiotics prior to getting the culture. She finished her macrobid and was feeling fine until last Thursday when she started having pelvic pressure  and urinary frequency. She is experiencing a constant vaginal burning sensation.    The last few days have been terrible, lots of pressure, voiding every hour, normal amounts (drinking a lot), feels empty. No fever, no flank pain. She c/o vaginal burning, it improved with antibiotics, was also started on vaginal estrogen. The estrogen burns slightly. No dysuria. The burning is just inside her vagina. Feels chaffed. The sensation is constant.  She just started the vaginal estrogen on 11/19/19 and is using it 2 x a week.   GYNECOLOGIC HISTORY: Patient's last menstrual period was 04/27/1995. Contraception:NA Menopausal hormone therapy: estradiol cream        OB History    Gravida  3   Para  2   Term  1   Preterm  1   AB  1   Living  2     SAB  1   TAB      Ectopic      Multiple      Live Births                 Patient Active Problem List   Diagnosis Date Noted  . Polyarthralgia 02/07/2018  . Low back pain 02/07/2018  . Acute left-sided thoracic back pain 11/30/2016  . Atypical chest pain 02/14/2015  . White coat hypertension 07/18/2014  . Routine general medical examination at a health care facility 07/18/2014    Past Medical History:  Diagnosis Date  . History of blood transfusion    As a child from father    Past Surgical History:  Procedure Laterality Date  . COMBINED HYSTEROSCOPY DIAGNOSTIC / D&C  08/1989   Benign  . KNEE ARTHROSCOPY Right 01/2003  . KNEE ARTHROSCOPY Left 08/2003  . MANDIBLE  SURGERY  06/1989   Reconstructive  . MOHS SURGERY  12/13   on forehead  . MOHS SURGERY Left 04/2016   face  . SHOULDER ARTHROSCOPY Left 03/1996    Current Outpatient Medications  Medication Sig Dispense Refill  . Cholecalciferol (VITAMIN D-3) 25 MCG (1000 UT) CAPS Take 1 capsule by mouth daily.     . Coenzyme Q10 (CO Q 10 PO) Take by mouth.     . estradiol (ESTRACE) 0.1 MG/GM vaginal cream Apply a small amount as instructed two to three times weekly 42.5 g 1  . fluorouracil (EFUDEX) 5 % cream     . Misc Natural Products (TART CHERRY ADVANCED PO) Take by mouth.    . Multiple Vitamin (MULTIVITAMIN PO) Take by mouth. 2 gummies    . nitrofurantoin, macrocrystal-monohydrate, (MACROBID) 100 MG capsule Take 1 capsule (100 mg total) by mouth 2 (two) times daily. 10 capsule 0  . TURMERIC PO Take by mouth.     No current facility-administered medications for this visit.     ALLERGIES: Lodine [etodolac] and Penicillins  Family History  Problem Relation Age of Onset  . Heart disease Mother   . Hypertension Mother   . Thyroid disease Mother   .  Heart disease Father   . Epilepsy Brother   . Heart disease Maternal Grandfather 23  . Heart disease Paternal Grandfather 79  . Other Sister        breast biopsy  . Osteoporosis Sister     Social History   Socioeconomic History  . Marital status: Married    Spouse name: Not on file  . Number of children: Not on file  . Years of education: Not on file  . Highest education level: Not on file  Occupational History  . Not on file  Tobacco Use  . Smoking status: Never Smoker  . Smokeless tobacco: Never Used  Vaping Use  . Vaping Use: Never used  Substance and Sexual Activity  . Alcohol use: No  . Drug use: No  . Sexual activity: Not Currently    Partners: Male    Birth control/protection: Post-menopausal  Other Topics Concern  . Not on file  Social History Narrative  . Not on file   Social Determinants of Health   Financial  Resource Strain:   . Difficulty of Paying Living Expenses:   Food Insecurity:   . Worried About Programme researcher, broadcasting/film/video in the Last Year:   . Barista in the Last Year:   Transportation Needs:   . Freight forwarder (Medical):   Marland Kitchen Lack of Transportation (Non-Medical):   Physical Activity:   . Days of Exercise per Week:   . Minutes of Exercise per Session:   Stress:   . Feeling of Stress :   Social Connections:   . Frequency of Communication with Friends and Family:   . Frequency of Social Gatherings with Friends and Family:   . Attends Religious Services:   . Active Member of Clubs or Organizations:   . Attends Banker Meetings:   Marland Kitchen Marital Status:   Intimate Partner Violence:   . Fear of Current or Ex-Partner:   . Emotionally Abused:   Marland Kitchen Physically Abused:   . Sexually Abused:     Review of Systems  Genitourinary: Positive for dysuria, frequency and urgency.    PHYSICAL EXAMINATION:    LMP 04/27/1995     General appearance: alert, cooperative and appears stated age Abdomen: soft, non-tender; non distended, no masses,  no organomegaly  Pelvic: External genitalia:  no lesions              Urethra:  normal appearing urethra with no masses, tenderness or lesions              Bartholins and Skenes: normal                 Vagina: atrophic appearing vagina with normal color and discharge, no lesions              Cervix: no cervical motion tenderness and no lesions              Bimanual Exam:  Uterus:  uterus anteverted, mobile, normal sized, mildly tender              Adnexa: no mass, fullness, tenderness              Bladder: very tender to palpation  Chaperone was present for exam.  +1 leuk, trace blood.  ASSESSMENT Urinary frequency Pelvic pressure Vaginal buring    PLAN Send urine for ua, c&s nuswab for vaginitis Treat with bactrim ds and pyridium Call with fever, flank pain or any other concerns She will increase the  vaginal estrogen to  a pea sized amount topically qd x 1 week, then go back to 2 x a week Can use Vaseline externally as needed   An After Visit Summary was printed and given to the patient.

## 2019-12-03 NOTE — Telephone Encounter (Signed)
Patient is calling in regards to having UTI. Offered patient to see different doctor due to Dr. Hyacinth Meeker not in office, patient declined. Patient stated she would like to speak with nurse.

## 2019-12-04 ENCOUNTER — Telehealth: Payer: Self-pay

## 2019-12-04 LAB — URINALYSIS, MICROSCOPIC ONLY
Bacteria, UA: NONE SEEN
Casts: NONE SEEN /lpf
Epithelial Cells (non renal): NONE SEEN /hpf (ref 0–10)
RBC, Urine: NONE SEEN /hpf (ref 0–2)
WBC, UA: NONE SEEN /hpf (ref 0–5)

## 2019-12-04 NOTE — Telephone Encounter (Signed)
Patient is calling in regards to pain medication. Patient states she was unable to pick it up but is feeling much better today. Patient would like to know if it is okay to not take the pain medication.

## 2019-12-04 NOTE — Telephone Encounter (Signed)
Spoke with pt. Pt wanting to know if ok to not take Pyridium Rx that was written for her on 12/03/19. Pt states already feeling better after taking 2 doses of Bactrim Rx. Pt advised ok to not take Pyridium if not needed. Pt agreeable and verbalized understanding.  Encounter closed.

## 2019-12-05 LAB — NUSWAB BV AND CANDIDA, NAA
Candida albicans, NAA: NEGATIVE
Candida glabrata, NAA: NEGATIVE

## 2019-12-05 LAB — URINE CULTURE

## 2020-01-22 ENCOUNTER — Ambulatory Visit: Payer: Self-pay

## 2020-01-22 ENCOUNTER — Ambulatory Visit: Payer: Medicare PPO | Admitting: Family Medicine

## 2020-01-22 ENCOUNTER — Ambulatory Visit (INDEPENDENT_AMBULATORY_CARE_PROVIDER_SITE_OTHER): Payer: Medicare PPO

## 2020-01-22 ENCOUNTER — Other Ambulatory Visit: Payer: Self-pay

## 2020-01-22 ENCOUNTER — Encounter: Payer: Self-pay | Admitting: Family Medicine

## 2020-01-22 VITALS — BP 158/80 | HR 74 | Ht 64.5 in | Wt 160.0 lb

## 2020-01-22 DIAGNOSIS — M25512 Pain in left shoulder: Secondary | ICD-10-CM | POA: Diagnosis not present

## 2020-01-22 DIAGNOSIS — M5412 Radiculopathy, cervical region: Secondary | ICD-10-CM

## 2020-01-22 MED ORDER — PREDNISONE 50 MG PO TABS: 50.0000 mg | ORAL_TABLET | Freq: Every day | ORAL | 0 refills | Status: DC

## 2020-01-22 MED ORDER — PREGABALIN 75 MG PO CAPS
75.0000 mg | ORAL_CAPSULE | Freq: Two times a day (BID) | ORAL | 3 refills | Status: DC | PRN
Start: 2020-01-22 — End: 2020-02-25

## 2020-01-22 NOTE — Progress Notes (Signed)
I, Christoper Fabian, LAT, ATC, am serving as scribe for Dr. Clementeen Graham.  Elizabeth Moses is a 74 y.o. female who presents to [facility] today for L shoulder pain x 2 months w/ no known MOI.  She was last seen by Dr. Katrinka Blazing on 06/05/19 for low back pain.  She locates her pain to her L superior shoulder w/ radiating pain into her L anterior chest/pec, upper trap and into the R upper arm.  Pain does not radiate into the hand.  No exertional chest pain or palpitations.  No trouble breathing.  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   Exam:  BP (!) 158/80 (BP Location: Left Arm, Patient Position: Sitting, Cuff Size: Normal)   Pulse 74   Ht 5' 4.5" (1.638 m)   Wt 160 lb (72.6 kg)   LMP 04/27/1995   SpO2 98%   BMI 27.04 kg/m  General: Well Developed, well nourished, and in no acute distress.   MSK: C-spine normal-appearing nontender cervical midline.  Normal cervical motion.  Negative Spurling's test left side. Reflex sensation and strength are intact.  Left shoulder normal-appearing nontender.  Normal shoulder motion.  Normal strength. Hawkins and Neer's test negative.  Negative impingement can test negative Yergason's and speeds test.    Lab and Radiology Results  X-ray images C-spine and T-spine obtained today personally and independently interpreted  C-spine: No severe DDD.  No fracture or malalignment.  T-spine: DDD C7-T1 no fracture or malalignment.  Await formal radiology review    Assessment and Plan: 74 y.o. female with left shoulder arm and chest and upper back pain.  Not exertional in nature  and similar to her recurrence of a chronic problem previously evaluated a few years ago.  This dominant her likely explanation is T1 radiculopathy.  She also has some periscapular muscle dysfunction that is probably contributing to this.  Plan for physical therapy course of Lyrica and backup course of prednisone.  Consider MRI if not improving in the near future.  Additionally if symptoms progress she would benefit from a cardiac work-up with her PCP.  Although I do think cardiac etiology is much less likely she has enough rest that it will be worthwhile having more extended work-up if needed.   PDMP not reviewed this encounter. Orders Placed This Encounter  Procedures  . DG Cervical Spine 2 or 3 views    Standing Status:   Future    Number of Occurrences:   1    Standing Expiration Date:   01/21/2021    Order Specific Question:   Reason for Exam (SYMPTOM  OR DIAGNOSIS REQUIRED)    Answer:   eval poss left arm aradicular pain t1    Order Specific Question:   Preferred imaging location?    Answer:   [facility]    Order Specific Question:   Radiology Contrast Protocol - do NOT remove file path    Answer:   [email or web address]  . DG Thoracic Spine W/Swimmers    Standing Status:   Future    Number of Occurrences:   1    Standing Expiration Date:   01/21/2021  Order Specific Question:   Reason for Exam (SYMPTOM  OR DIAGNOSIS REQUIRED)    Answer:   eval poss left T1 radicular pain    Order Specific Question:   Preferred imaging location?    Answer:   [facility]    Order Specific Question:   Radiology Contrast Protocol - do NOT remove file path    Answer:   [email or web address]  . Ambulatory referral to Physical Therapy    Referral Priority:   Routine    Referral Type:   Physical Medicine    Referral Reason:   Specialty Services Required    Requested Specialty:   Physical  Therapy   Meds ordered this encounter  Medications  . pregabalin (LYRICA) 75 MG capsule    Sig: Take 1 capsule (75 mg total) by mouth 2 (two) times daily as needed.    Dispense:  60 capsule    Refill:  3  . predniSONE (DELTASONE) 50 MG tablet    Sig: Take 1 tablet (50 mg total) by mouth daily.    Dispense:  5 tablet    Refill:  0     Discussed warning signs or symptoms. Please see discharge instructions. Patient expresses understanding.   The above documentation has been reviewed and is accurate and complete Clementeen Graham, M.D.

## 2020-01-22 NOTE — Patient Instructions (Signed)
Thank you for coming in today. I think you probably have a pinched nerve (likely T1 nerve root).  There may be a muscle dysfunction or irrtation as well.   Please get an Xray today before you leave  I've referred you to Physical Therapy.  Let us know if you don't hear from them in one week.  Take the lyrica as needed for nerve pain.   Take the prednisone if you get worse.   Recheck with me in 4 weeks.   If not improving next step is MRI cervical spine.     Cervical Radiculopathy  Cervical radiculopathy happens when a nerve in the neck (a cervical nerve) is pinched or bruised. This condition can happen because of an injury to the cervical spine (vertebrae) in the neck, or as part of the normal aging process. Pressure on the cervical nerves can cause pain or numbness that travels from the neck all the way down into the arm and fingers. Usually, this condition gets better with rest. Treatment may be needed if the condition does not improve. What are the causes? This condition may be caused by:  A neck injury.  A bulging (herniated) disk.  Muscle spasms.  Muscle tightness in the neck because of overuse.  Arthritis.  Breakdown or degeneration in the bones and joints of the spine (spondylosis) due to aging.  Bone spurs that may develop near the cervical nerves. What are the signs or symptoms? Symptoms of this condition include:  Pain. The pain may travel from the neck to the arm and hand. The pain can be severe or irritating. It may be worse when you move your neck.  Numbness or tingling in your arm or hand.  Weakness in the affected arm and hand, in severe cases. How is this diagnosed? This condition may be diagnosed based on your symptoms, your medical history, and a physical exam. You may also have tests, including:  X-rays.  A CT scan.  An MRI.  An electromyogram (EMG).  Nerve conduction tests. How is this treated? In many cases, treatment is not needed for  this condition. With rest, the condition usually gets better over time. If treatment is needed, options may include:  Wearing a soft neck collar (cervical collar) for short periods of time, as told by your health care provider.  Doing physical therapy to strengthen your neck muscles.  Taking medicines, such as NSAIDs or oral corticosteroids.  Having spinal injections, in severe cases.  Having surgery. This may be needed if other treatments do not help. Different types of surgery may be done depending on the cause of this condition. Follow these instructions at home: If you have a cervical collar:  Wear it as told by your health care provider. Remove it only as told by your health care provider.  Ask your health care provider if you can remove the collar for cleaning and bathing. If you are allowed to remove the collar for cleaning or bathing: ? Follow instructions from your health care provider about how to remove the collar safely. ? Clean the collar by wiping it with mild soap and water and drying it completely. ? Take out any removable pads in the collar every 1-2 days, and wash them by hand with soap and water. Let them air-dry completely before you put them back in the collar. ? Check your skin under the collar for irritation or sores. If you see any, tell your health care provider. Managing pain  Take over-the-counter and prescription medicines only as told by your health care provider.  If directed, put ice on the affected area. ? If you have a soft neck collar, remove it as told by your health care provider. ? Put ice in a plastic bag. ? Place a towel between your skin and the bag. ? Leave the ice on for 20 minutes, 2-3 times a day.  If applying ice does not help, you can try using heat. Use the heat source that your health care provider recommends, such as a moist heat pack or a heating pad. ? Place a towel between your skin and the heat source. ? Leave the heat on  for 20-30 minutes. ? Remove the heat if your skin turns bright red. This is especially important if you are unable to feel pain, heat, or cold. You may have a greater risk of getting burned.  Try a gentle neck and shoulder massage to help relieve symptoms. Activity  Rest as needed.  Return to your normal activities as told by your health care provider. Ask your health care provider what activities are safe for you.  Do stretching and strengthening exercises as told by your health care provider or physical therapist.  Do not lift anything that is heavier than 10 lb (4.5 kg) until your health care provider tells you that it is safe. General instructions  Use a flat pillow when you sleep.  Do not drive while wearing a cervical collar. If you do not have a cervical collar, ask your health care provider if it is safe to drive while your neck heals.  Ask your health care provider if the medicine prescribed to you requires you to avoid driving or using heavy machinery.  Do not use any products that contain nicotine or tobacco, such as cigarettes, e-cigarettes, and chewing tobacco. These can delay healing. If you need help quitting, ask your health care provider.  Keep all follow-up visits as told by your health care provider. This is important. Contact a health care provider if:  Your condition does not improve with treatment. Get help right away if:  Your pain gets much worse and cannot be controlled with medicines.  You have weakness or numbness in your hand, arm, face, or leg.  You have a high fever.  You have a stiff, rigid neck.  You lose control of your bowels or your bladder (have incontinence).  You have trouble with walking, balance, or speaking. Summary  Cervical radiculopathy happens when a nerve in the neck is pinched or bruised.  A nerve can get pinched from a bulging disk, arthritis, muscle spasms, or an injury to the neck.  Symptoms include pain, tingling, or  numbness radiating from the neck into the arm or hand. Weakness can also occur in severe cases.  Treatment may include rest, wearing a cervical collar, and physical therapy. Medicines may be prescribed to help with pain. In severe cases, injections or surgery may be needed. This information is not intended to replace advice given to you by your health care provider. Make sure you discuss any questions you have with your health care provider. Document Revised: 03/03/2018 Document Reviewed: 03/03/2018 Elsevier Patient Education  2020 [employer].

## 2020-01-23 ENCOUNTER — Encounter: Payer: Self-pay | Admitting: Family Medicine

## 2020-01-23 NOTE — Progress Notes (Signed)
X-ray thoracic spine looks normal to radiology

## 2020-01-23 NOTE — Progress Notes (Signed)
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

## 2020-01-28 ENCOUNTER — Ambulatory Visit: Payer: Medicare PPO | Attending: Family Medicine | Admitting: Physical Therapy

## 2020-01-28 ENCOUNTER — Encounter: Payer: Self-pay | Admitting: Physical Therapy

## 2020-01-28 ENCOUNTER — Other Ambulatory Visit: Payer: Self-pay

## 2020-01-28 DIAGNOSIS — R252 Cramp and spasm: Secondary | ICD-10-CM

## 2020-01-28 DIAGNOSIS — M25512 Pain in left shoulder: Secondary | ICD-10-CM | POA: Diagnosis not present

## 2020-01-28 DIAGNOSIS — M542 Cervicalgia: Secondary | ICD-10-CM

## 2020-01-28 NOTE — Patient Instructions (Signed)
  Access Code: [email or web address] URL: [email or web address] Date: 01/28/2020 Prepared by: Stacie Glaze  Exercises Seated Shoulder Shrugs - 2 x daily - 7 x weekly - 1 sets - 10 reps - 3 hold Seated Scapular Retraction - 2 x daily - 7 x weekly - 1 sets - 10 reps - 3 hold Latissimus Dorsi Stretch at Wall - 2 x daily - 7 x weekly - 1 sets - 5 reps - 15 hold

## 2020-01-28 NOTE — Therapy (Signed)
[facility] [address] [place], [place], [address] Phone: [phone]   Fax:  [phone]  Physical Therapy Evaluation  Patient Details  Name: Elizabeth Moses MRN: [id number] Date of Birth: 08/04/1945 Referring Provider (PT): Cheron Every Date: 01/28/2020   PT End of Session - 01/28/20 1543    Visit Number 1    Date for PT Re-Evaluation 03/29/20    PT Start Time 1500    PT Stop Time 1552    PT Time Calculation (min) 52 min    Activity Tolerance Patient tolerated treatment well    Behavior During Therapy [facility] for tasks assessed/performed           Past Medical History:  Diagnosis Date  . History of blood transfusion    As a child from father    Past Surgical History:  Procedure Laterality Date  . COMBINED HYSTEROSCOPY DIAGNOSTIC / D&C  08/1989   Benign  . KNEE ARTHROSCOPY Right 01/2003  . KNEE ARTHROSCOPY Left 08/2003  . MANDIBLE SURGERY  06/1989   Reconstructive  . MOHS SURGERY  12/13   on forehead  . MOHS SURGERY Left 04/2016   face  . SHOULDER ARTHROSCOPY Left 03/1996    There were no vitals filed for this visit.    Subjective Assessment - 01/28/20 1503    Subjective Patient reports a few months of left shoulder pain, no known cause, recent x-rays showed some mild DDD.  She is right handed.  Yardwork and exercises really cause some incresae of pain    Limitations Lifting;House hold activities    Patient Stated Goals have less pain, difficulty with ADL's    Currently in Pain? Yes    Pain Score 2     Pain Location Shoulder    Pain Orientation Left;Anterior;Posterior;Lateral    Pain Descriptors / Indicators Aching;Tightness    Pain Type Acute pain    Pain Radiating Towards denies    Pain Onset More than a month ago    Pain Frequency Intermittent    Aggravating Factors  yardwork, pulling, lifting, pain up to 8/10    Pain Relieving Factors change positions, putting pressure, pain can be 0/10,  now taking pain meds    Effect of Pain on Daily Activities difficulty sleeping, driving, yardwork, lifting grandson              [facility] PT Assessment - 01/28/20 0001      Assessment   Medical Diagnosis left shoulder pain/cervicalgia    Referring Provider (PT) Denyse Amass    Onset Date/Surgical Date 01/21/20    Hand Dominance Right    Prior Therapy no      Balance Screen   Has the patient fallen in the past 6 months No    Has the patient had a decrease in activity level because of a fear of falling?  No    Is the patient reluctant to leave their home because of a fear of falling?  No      Home Environment   Additional Comments does a lot of yardwork, drives, takes care of kids      Prior Function   Level of Independence Independent    Vocation Retired    Leisure tennis2x/week, some exercises 2x/week      Posture/Postural Control   Posture Comments rounded shoulders      ROM / Strength   AROM / PROM / Strength AROM;Strength      AROM   Overall AROM  Comments left shoulder ROM is WFL's with some mild pain with IR and ER    AROM Assessment Site Shoulder    Right/Left Shoulder Left      Strength   Overall Strength Comments 4+/5 with mild pain for abduction and ER      Palpation   Palpation comment she is tender in the left upper trap, the left teres and the serratus anterior       Special Tests   Other special tests negative empty can, impingement                      Objective measurements completed on examination: See above findings.       [facility] Adult PT Treatment/Exercise - 01/28/20 0001      Modalities   Modalities Electrical Stimulation;Moist Heat      Moist Heat Therapy   Number Minutes Moist Heat 15 Minutes    Moist Heat Location Shoulder      Electrical Stimulation   Electrical Stimulation Location left shoulder teres, rhomboid and upper trap area    Electrical Stimulation Action IFC    Electrical Stimulation Parameters sitting     Electrical Stimulation Goals Pain                    PT Short Term Goals - 01/28/20 1604      PT SHORT TERM GOAL #1   Title independent with initial HEP    Time 2    Period Weeks    Status New             PT Long Term Goals - 01/28/20 1605      PT LONG TERM GOAL #1   Title understadn posture and body mechanics    Time 10    Period Weeks      PT LONG TERM GOAL #2   Title report 50% less pain with sleep    Time 10    Period Weeks    Status New      PT LONG TERM GOAL #3   Title report no difficulty lifting grand child    Time 10    Period Weeks    Status New      PT LONG TERM GOAL #4   Title independent and safe with advanced HEP      PT LONG TERM GOAL #5   Time 10    Period Weeks    Status New                  Plan - 01/28/20 1545    Clinical Impression Statement Patient is very active.  She plays tennis twice a week, does a lot of yardwork and takes care of a grandchild.  She started having left shoulder pain about 2 months ago, she is unsure of a cause, she is right handed,  X-rays of the neck show mild DDD, tests of the shoulder were negative, has great ROM, she does have a lot of tenderness in the teres, upper trap, serratus , biceps and pectoral area on the left, she seems to have weak scapular stabilizers.    Stability/Clinical Decision Making Evolving/Moderate complexity    Clinical Decision Making Low    Rehab Potential Good    PT Frequency 1x / week    PT Duration 8 weeks    PT Treatment/Interventions ADLs/Self Care Home Management;Cryotherapy;Electrical Stimulation;Iontophoresis 4mg /ml Dexamethasone;Moist Heat;Ultrasound;Therapeutic activities;Therapeutic exercise;Neuromuscular re-education;Manual techniques;Patient/family education;Dry needling    PT Next Visit Plan  work on Soil scientist with Plan of Care Patient           Patient will benefit from skilled therapeutic intervention in order to  improve the following deficits and impairments:  Impaired tone, Increased muscle spasms, Impaired UE functional use, Pain, Postural dysfunction, Decreased strength, Impaired flexibility  Visit Diagnosis: Acute pain of left shoulder - Plan: PT plan of care cert/re-cert  Cramp and spasm - Plan: PT plan of care cert/re-cert  Cervicalgia - Plan: PT plan of care cert/re-cert     Problem List Patient Active Problem List   Diagnosis Date Noted  . Polyarthralgia 02/07/2018  . Low back pain 02/07/2018  . Acute left-sided thoracic back pain 11/30/2016  . Atypical chest pain 02/14/2015  . White coat hypertension 07/18/2014  . Routine general medical examination at a health care facility 07/18/2014    Naquisha Whitehair W/. PT 01/28/2020, 4:10 PM  [facility] [address] [place], [place], [address] Phone: [phone]   Fax:  [phone]  Name: Elizabeth Moses MRN: [id number] Date of Birth: [birth year]

## 2020-02-03 ENCOUNTER — Encounter: Payer: Self-pay | Admitting: Family Medicine

## 2020-02-03 DIAGNOSIS — M5412 Radiculopathy, cervical region: Secondary | ICD-10-CM

## 2020-02-05 ENCOUNTER — Ambulatory Visit: Payer: Medicare PPO | Admitting: Physical Therapy

## 2020-02-05 ENCOUNTER — Encounter: Payer: Self-pay | Admitting: Physical Therapy

## 2020-02-05 ENCOUNTER — Other Ambulatory Visit: Payer: Self-pay

## 2020-02-05 DIAGNOSIS — M25512 Pain in left shoulder: Secondary | ICD-10-CM

## 2020-02-05 DIAGNOSIS — R252 Cramp and spasm: Secondary | ICD-10-CM

## 2020-02-05 DIAGNOSIS — M542 Cervicalgia: Secondary | ICD-10-CM

## 2020-02-05 NOTE — Therapy (Signed)
[facility] [address] [place], [place], [address] Phone: [phone]   Fax:  [phone]  Physical Therapy Treatment  Patient Details  Name: Elizabeth Moses MRN: [id number] Date of Birth: [birth year] Referring Provider (PT): Cheron Every Date: 02/05/2020   PT End of Session - 02/05/20 1602    Visit Number 2    Date for PT Re-Evaluation 03/29/20    PT Start Time 1520    PT Stop Time 1615    PT Time Calculation (min) 55 min    Activity Tolerance Patient tolerated treatment well    Behavior During Therapy [facility] for tasks assessed/performed           Past Medical History:  Diagnosis Date  . History of blood transfusion    As a child from father    Past Surgical History:  Procedure Laterality Date  . COMBINED HYSTEROSCOPY DIAGNOSTIC / D&C  08/1989   Benign  . KNEE ARTHROSCOPY Right 01/2003  . KNEE ARTHROSCOPY Left 08/2003  . MANDIBLE SURGERY  06/1989   Reconstructive  . MOHS SURGERY  12/13   on forehead  . MOHS SURGERY Left 04/2016   face  . SHOULDER ARTHROSCOPY Left 03/1996    There were no vitals filed for this visit.   Subjective Assessment - 02/05/20 1523    Subjective Patient reports that she played tennis this morning, has an MRI scheduled for 2 weeks from now.  She reports that the last treatment felt good, reports sunday was a bad "achey" day    Currently in Pain? Yes    Pain Score 3     Pain Location Shoulder    Pain Orientation Left    Pain Descriptors / Indicators Aching    Aggravating Factors  stress                             [facility] Adult PT Treatment/Exercise - 02/05/20 0001      Modalities   Modalities Electrical Stimulation;Moist Heat      Moist Heat Therapy   Number Minutes Moist Heat 15 Minutes    Moist Heat Location Shoulder      Electrical Stimulation   Electrical Stimulation Location left shoulder teres, rhomboid and upper trap area    Electrical  Stimulation Action IFC    Electrical Stimulation Parameters supine     Electrical Stimulation Goals Pain      Manual Therapy   Manual Therapy Soft tissue mobilization;Manual Traction    Manual therapy comments some gentle stretches of the ribs    Soft tissue mobilization to the left upper trap, neck, rhomboidm teres, serratus,     Manual Traction occipital release manual shoulder depression                    PT Short Term Goals - 02/05/20 1605      PT SHORT TERM GOAL #1   Title independent with initial HEP    Status Achieved             PT Long Term Goals - 01/28/20 1605      PT LONG TERM GOAL #1   Title understadn posture and body mechanics    Time 10    Period Weeks      PT LONG TERM GOAL #2   Title report 50% less pain with sleep    Time 10    Period Weeks  Status New      PT LONG TERM GOAL #3   Title report no difficulty lifting grand child    Time 10    Period Weeks    Status New      PT LONG TERM GOAL #4   Title independent and safe with advanced HEP      PT LONG TERM GOAL #5   Time 10    Period Weeks    Status New                 Plan - 02/05/20 1602    Clinical Impression Statement Patient has a lot of spasms in the upper trap, teres and the serratus, she is very tender here.  I added some gentle shoulder stretches and occipital release. Again her ROM of the shoulder is good and had no positive tests for the shoulder, has some radicular like symptoms down the left UE, I added the manual traction    PT Next Visit Plan see how the manual distraction did and could try traction    Consulted and Agree with Plan of Care Patient           Patient will benefit from skilled therapeutic intervention in order to improve the following deficits and impairments:  Impaired tone, Increased muscle spasms, Impaired UE functional use, Pain, Postural dysfunction, Decreased strength, Impaired flexibility  Visit Diagnosis: Acute pain of left  shoulder  Cramp and spasm  Cervicalgia     Problem List Patient Active Problem List   Diagnosis Date Noted  . Polyarthralgia 02/07/2018  . Low back pain 02/07/2018  . Acute left-sided thoracic back pain 11/30/2016  . Atypical chest pain 02/14/2015  . White coat hypertension 07/18/2014  . Routine general medical examination at a health care facility 07/18/2014    Jearld Lesch., PT 02/05/2020, 4:05 PM  [facility] [address] [place], [place], [address] Phone: [phone]   Fax:  [phone]  Name: Elizabeth Moses MRN: [id number] Date of Birth: 1945-11-23

## 2020-02-05 NOTE — Addendum Note (Signed)
Addended by: Edwena Felty T on: 02/05/2020 05:09 AM   Modules accepted: Orders

## 2020-02-13 ENCOUNTER — Other Ambulatory Visit: Payer: Self-pay

## 2020-02-13 ENCOUNTER — Encounter: Payer: Self-pay | Admitting: Physical Therapy

## 2020-02-13 ENCOUNTER — Ambulatory Visit: Payer: Medicare PPO | Admitting: Physical Therapy

## 2020-02-13 DIAGNOSIS — M25512 Pain in left shoulder: Secondary | ICD-10-CM

## 2020-02-13 DIAGNOSIS — M542 Cervicalgia: Secondary | ICD-10-CM

## 2020-02-13 DIAGNOSIS — R252 Cramp and spasm: Secondary | ICD-10-CM

## 2020-02-13 NOTE — Therapy (Signed)
[place]. [place], [place], [address] Phone: [phone]   Fax:  [phone]  Physical Therapy Treatment  Patient Details  Name: Elizabeth Moses MRN: [id number] Date of Birth: [birth year] Referring Provider (PT): Marikay Alar Date: 02/13/2020   PT End of Session - 02/13/20 1521    Visit Number 3    Date for PT Re-Evaluation 03/29/20    PT Start Time 2542    PT Stop Time 1533    PT Time Calculation (min) 50 min    Activity Tolerance Patient tolerated treatment well    Behavior During Therapy [facility] for tasks assessed/performed           Past Medical History:  Diagnosis Date  . History of blood transfusion    As a child from father    Past Surgical History:  Procedure Laterality Date  . COMBINED HYSTEROSCOPY DIAGNOSTIC / D&C  08/1989   Benign  . KNEE ARTHROSCOPY Right 01/2003  . KNEE ARTHROSCOPY Left 08/2003  . MANDIBLE SURGERY  06/1989   Reconstructive  . MOHS SURGERY  12/13   on forehead  . MOHS SURGERY Left 04/2016   face  . SHOULDER ARTHROSCOPY Left 03/1996    There were no vitals filed for this visit.   Subjective Assessment - 02/13/20 1442    Subjective Patient reports taht after the last treatment she left and it was the best she had felt in over a month, reports that she is still doing well    Currently in Pain? Yes    Pain Score 2     Pain Location Shoulder    Pain Orientation Left    Pain Relieving Factors the last treatment was great                             [facility] Adult PT Treatment/Exercise - 02/13/20 0001      Modalities   Modalities Electrical Stimulation;Moist Heat      Moist Heat Therapy   Number Minutes Moist Heat 15 Minutes    Moist Heat Location Shoulder      Electrical Stimulation   Electrical Stimulation Location left shoulder teres, rhomboid and upper trap area    Electrical Stimulation Action IFC    Electrical Stimulation Parameters  supine    Electrical Stimulation Goals Pain      Manual Therapy   Manual Therapy Soft tissue mobilization;Manual Traction    Manual therapy comments some gentle stretches of the ribs, left shoulder and cervical spine    Soft tissue mobilization to the left upper trap, neck, rhomboidm teres, serratus,     Manual Traction occipital release manual shoulder depression                    PT Short Term Goals - 02/13/20 1523      PT SHORT TERM GOAL #1   Title independent with initial HEP             PT Long Term Goals - 02/13/20 1523      PT LONG TERM GOAL #1   Title understadn posture and body mechanics    Status Partially Met                 Plan - 02/13/20 1522    Clinical Impression Statement Patietn reports that the last treatment was amazing, reports days of pain relief and still doing better.  I  added UE stretches and some cervical stretches.  She was less tender and sore with the trigger point areas.    PT Next Visit Plan add exercises for home to help with scapular stabilization    Consulted and Agree with Plan of Care Patient           Patient will benefit from skilled therapeutic intervention in order to improve the following deficits and impairments:  Impaired tone, Increased muscle spasms, Impaired UE functional use, Pain, Postural dysfunction, Decreased strength, Impaired flexibility  Visit Diagnosis: Acute pain of left shoulder  Cramp and spasm  Cervicalgia     Problem List Patient Active Problem List   Diagnosis Date Noted  . Polyarthralgia 02/07/2018  . Low back pain 02/07/2018  . Acute left-sided thoracic back pain 11/30/2016  . Atypical chest pain 02/14/2015  . White coat hypertension 07/18/2014  . Routine general medical examination at a health care facility 07/18/2014    Sumner Boast., PT 02/13/2020, 3:24 PM  [address]. [place], [place],  [address] Phone: [phone]   Fax:  [phone]  Name: Elizabeth Moses MRN: [id number] Date of Birth: 06-29-1945

## 2020-02-18 ENCOUNTER — Ambulatory Visit: Payer: Medicare PPO | Admitting: Family Medicine

## 2020-02-19 ENCOUNTER — Other Ambulatory Visit: Payer: Self-pay

## 2020-02-19 ENCOUNTER — Ambulatory Visit
Admission: RE | Admit: 2020-02-19 | Discharge: 2020-02-19 | Disposition: A | Discharge status: Home / Self Care | Payer: Medicare PPO | Source: Ambulatory Visit | Attending: Family Medicine | Admitting: Family Medicine

## 2020-02-19 ENCOUNTER — Ambulatory Visit: Payer: Medicare PPO | Admitting: Physical Therapy

## 2020-02-19 ENCOUNTER — Encounter: Payer: Self-pay | Admitting: Physical Therapy

## 2020-02-19 DIAGNOSIS — M542 Cervicalgia: Secondary | ICD-10-CM

## 2020-02-19 DIAGNOSIS — R252 Cramp and spasm: Secondary | ICD-10-CM

## 2020-02-19 DIAGNOSIS — M25512 Pain in left shoulder: Secondary | ICD-10-CM | POA: Diagnosis not present

## 2020-02-19 DIAGNOSIS — M5412 Radiculopathy, cervical region: Secondary | ICD-10-CM

## 2020-02-19 NOTE — Therapy (Signed)
[facility] [place] [facility] [address]. [place], [place], [address] Phone: [phone]   Fax:  [phone]  Physical Therapy Treatment  Patient Details  Name: Elizabeth Moses MRN: [id number] Date of Birth: 04/04/1946 Referring Provider (PT): Cheron Every Date: 02/19/2020   PT End of Session - 02/19/20 1600    Visit Number 4    Date for PT Re-Evaluation 03/29/20    PT Start Time 1523    PT Stop Time 1612    PT Time Calculation (min) 49 min    Activity Tolerance Patient tolerated treatment well    Behavior During Therapy [facility] for tasks assessed/performed           Past Medical History:  Diagnosis Date  . History of blood transfusion    As a child from father    Past Surgical History:  Procedure Laterality Date  . COMBINED HYSTEROSCOPY DIAGNOSTIC / D&C  08/1989   Benign  . KNEE ARTHROSCOPY Right 01/2003  . KNEE ARTHROSCOPY Left 08/2003  . MANDIBLE SURGERY  06/1989   Reconstructive  . MOHS SURGERY  12/13   on forehead  . MOHS SURGERY Left 04/2016   face  . SHOULDER ARTHROSCOPY Left 03/1996    There were no vitals filed for this visit.   Subjective Assessment - 02/19/20 1538    Subjective Patient reports that she had increased left shoulder pain after the last treatment about an hour later.  She had an MRI this morning that showed mild DDD and an osteophyte and some foraminal stenosis at C4-5    Currently in Pain? Yes    Pain Score 6     Pain Location Neck    Pain Orientation Left    Pain Descriptors / Indicators Aching    Pain Relieving Factors the last treatment made it worse                             [facility] Adult PT Treatment/Exercise - 02/19/20 0001      Self-Care   Self-Care Other Self-Care Comments    Other Self-Care Comments  Patient had MRI and brought in the results, we talked about what the worse and the impression from the radiologist, we talked about what PT can do for this, she  asked about injections and reported her past experiences., I reviewed the anatomy and why the traction and the strengthening by PT should help      Modalities   Modalities Electrical Stimulation;Moist Heat      Moist Heat Therapy   Number Minutes Moist Heat 15 Minutes    Moist Heat Location Cervical;Shoulder      Electrical Stimulation   Electrical Stimulation Location left upper trap and left neck area    Electrical Stimulation Action IFC    Electrical Stimulation Parameters supine    Electrical Stimulation Goals Pain      Manual Therapy   Manual Therapy Soft tissue mobilization    Soft tissue mobilization to the left upper trap and the cervical area, did not do any distraction today since the last time made her worse                    PT Short Term Goals - 02/13/20 1523      PT SHORT TERM GOAL #1   Title independent with initial HEP             PT Long Term  Goals - 02/19/20 1605      PT LONG TERM GOAL #1   Title understadn posture and body mechanics    Status Achieved                 Plan - 02/19/20 1600    Clinical Impression Statement Patient had much worse pain after the last visit, I am thinking it could have been the manual distraction.  I did not do this today, she did have results of her MRI that she had this AM.  that I went over with her.    PT Next Visit Plan will add exercises and may try traction    Consulted and Agree with Plan of Care Patient           Patient will benefit from skilled therapeutic intervention in order to improve the following deficits and impairments:  Impaired tone, Increased muscle spasms, Impaired UE functional use, Pain, Postural dysfunction, Decreased strength, Impaired flexibility  Visit Diagnosis: Acute pain of left shoulder  Cramp and spasm  Cervicalgia     Problem List Patient Active Problem List   Diagnosis Date Noted  . Polyarthralgia 02/07/2018  . Low back pain 02/07/2018  . Acute  left-sided thoracic back pain 11/30/2016  . Atypical chest pain 02/14/2015  . White coat hypertension 07/18/2014  . Routine general medical examination at a health care facility 07/18/2014    Jearld Lesch., PT 02/19/2020, 4:06 PM  [facility] [place] [facility] [address] [place], [place], [address] Phone: [phone]   Fax:  [phone]  Name: Elizabeth Moses MRN: [id number] Date of Birth: 02-Jan-1946

## 2020-02-20 ENCOUNTER — Other Ambulatory Visit: Payer: Medicare PPO

## 2020-02-20 NOTE — Progress Notes (Signed)
MRI cervical spine shows some arthritis but no significant pinched nerve in an area I was expecting.  Recommend keeping scheduled follow-up appointment with me on November 1.  We will repeat exam and discuss next steps in treatment options.

## 2020-02-25 ENCOUNTER — Other Ambulatory Visit: Payer: Self-pay

## 2020-02-25 ENCOUNTER — Ambulatory Visit: Payer: Medicare PPO | Admitting: Family Medicine

## 2020-02-25 VITALS — BP 132/78 | HR 87 | Ht 64.5 in | Wt 160.2 lb

## 2020-02-25 DIAGNOSIS — M5412 Radiculopathy, cervical region: Secondary | ICD-10-CM

## 2020-02-25 NOTE — Patient Instructions (Addendum)
Thank you for coming in today.  Plan to continue home exercises.  Taper off physical therapy.  Recheck with me as needed.  Use gabapentin sparingly as needed.   Recheck with me as needed.

## 2020-02-25 NOTE — Progress Notes (Signed)
Marjo Bicker LAT, ATC,acting as a scribe for Clementeen Graham, MD.  Elizabeth Moses is a 74 y.o. female who presents to [facility] today for f/u c L superior shoulder c radiating pain into her L anterior chest/pec, upper trap and into the R upper arm. Pn did not radiate into hand. Pt was last seen by Dr. Denyse Amass on 01/22/20 and was referred to PT and has 4 visits. Pt was advised to take Lyrica for nerve pn and Prednisone if it gets worse. Cervical MRI was suggested if no improvement.   Dx Imaging: 01/22/20 cspine and thoracic XR         02/19/20 cspine MRI  Since her last visit, pt reports that she has been going to PT and that it's really helping. Pt reports no pain, just soreness from exercises. Pt reports being able to play tennis this morning.  Did not fill prednisone and takes the gabapentin occasionally at night. She did not tolerate Lyrica.  Pertinent review of systems: No fevers or chills  Relevant historical information: Polyarthralgia   Exam:  BP 132/78 (BP Location: Right Arm, Patient Position: Sitting)   Pulse 87   Ht 5' 4.5" (1.638 m)   Wt 160 lb 3.2 oz (72.7 kg)   LMP 04/27/1995   SpO2 98%   BMI 27.07 kg/m  General: Well Developed, well nourished, and in no acute distress.   MSK: C-spine normal-appearing nontender. Upper extremity strength reflexes and sensation are equal normal throughout.    Lab and Radiology Results DG Cervical Spine 2 or 3 views  Result Date: 01/22/2020 CLINICAL DATA:  Left-sided neck pain for 2 months EXAM: CERVICAL SPINE - 2-3 VIEW COMPARISON:  None. FINDINGS: There is no evidence of cervical spine fracture or prevertebral soft tissue swelling. Alignment is normal. Mild degenerative endplate spurring within the midthoracic spine with relative preservation of the disc heights. Mild arthropathy of the facet joints. IMPRESSION: Mild degenerative changes of the cervical spine. No acute findings. Electronically Signed    By: Duanne Guess D.O.   On: 01/22/2020 16:19   DG Thoracic Spine W/Swimmers  Result Date: 01/22/2020 CLINICAL DATA:  Left-sided neck and shoulder pain for 2 months EXAM: THORACIC SPINE - 3 VIEWS COMPARISON:  11/22/2016 FINDINGS: There is no evidence of thoracic spine fracture. Alignment is normal. Intervertebral disc spaces are maintained. No other significant bone abnormalities are identified. IMPRESSION: Negative. Electronically Signed   By: Duanne Guess D.O.   On: 01/22/2020 16:20   MR CERVICAL SPINE WO CONTRAST  Result Date: 02/19/2020 CLINICAL DATA:  Chronic bilateral neck and shoulder pain EXAM: MRI CERVICAL SPINE WITHOUT CONTRAST TECHNIQUE: Multiplanar, multisequence MR imaging of the cervical spine was performed. No intravenous contrast was administered. COMPARISON:  None. FINDINGS: Alignment: Preserved. Vertebrae: Vertebral body heights are maintained. There is no significant marrow edema. No suspicious osseous lesion. Cord: Normal caliber and signal. Posterior Fossa, vertebral arteries, paraspinal tissues: Unremarkable. Disc levels: C2-C3:  No significant canal or foraminal stenosis. C3-C4: Minimal disc bulge. No significant canal foraminal stenosis. C4-C5: Minimal disc bulge with small endplate osteophytes and uncovertebral hypertrophy. No canal stenosis. Mild foraminal stenosis. C5-C6:  No significant canal or foraminal stenosis. C6-C7:  No significant canal or foraminal stenosis. C7-T1:  No significant canal or foraminal stenosis. IMPRESSION: Overall minor degenerative changes as detailed above. There is mild foraminal stenosis at C4-C5 Electronically Signed   By: Guadlupe Spanish M.D.   On: 02/19/2020 08:40   I, Clementeen Graham, personally (  independently) visualized and performed the interpretation of the MRI images attached in this note. Minimal degenerative changes with minimal stenosis.    Assessment and Plan: 74 y.o. female with left arm and chest pain thought to be due to C5  radiculopathy based on MRI findings and distribution of symptoms. Originally the distribution was thought to be T1 based on current MRI that is less likely. She does have minimal foraminal stenosis at C4-C5 on MRI. Regardless she is having significant improvement with physical therapy. Plan to continue PT and limited intermittent gabapentin. Continue her transition to home exercise program. Recheck back as needed.     Discussed warning signs or symptoms. Please see discharge instructions. Patient expresses understanding.   The above documentation has been reviewed and is accurate and complete Clementeen Graham, M.D.    Total encounter time 20 minutes including face-to-face time with the patient and, reviewing past medical record, and charting on the date of service.   MRI findings and treatment plan and backup plan.

## 2020-02-26 ENCOUNTER — Encounter: Payer: Self-pay | Admitting: Physical Therapy

## 2020-02-26 ENCOUNTER — Ambulatory Visit: Payer: Medicare PPO | Attending: Family Medicine | Admitting: Physical Therapy

## 2020-02-26 ENCOUNTER — Other Ambulatory Visit: Payer: Self-pay

## 2020-02-26 DIAGNOSIS — R252 Cramp and spasm: Secondary | ICD-10-CM

## 2020-02-26 DIAGNOSIS — M542 Cervicalgia: Secondary | ICD-10-CM | POA: Diagnosis present

## 2020-02-26 DIAGNOSIS — M25512 Pain in left shoulder: Secondary | ICD-10-CM | POA: Diagnosis present

## 2020-02-26 NOTE — Therapy (Signed)
[place]. [place], [place], [address] Phone: [phone]   Fax:  [phone]  Physical Therapy Treatment  Patient Details  Name: Elizabeth Moses MRN: [id number] Date of Birth: January 20, 1946 Referring Provider (PT): Marikay Alar Date: 02/26/2020   PT End of Session - 02/26/20 1529    Visit Number 5    Date for PT Re-Evaluation 03/29/20    PT Start Time 2878    PT Stop Time 1535    PT Time Calculation (min) 52 min    Activity Tolerance Patient tolerated treatment well    Behavior During Therapy [facility] for tasks assessed/performed           Past Medical History:  Diagnosis Date  . History of blood transfusion    As a child from father    Past Surgical History:  Procedure Laterality Date  . COMBINED HYSTEROSCOPY DIAGNOSTIC / D&C  08/1989   Benign  . KNEE ARTHROSCOPY Right 01/2003  . KNEE ARTHROSCOPY Left 08/2003  . MANDIBLE SURGERY  06/1989   Reconstructive  . MOHS SURGERY  12/13   on forehead  . MOHS SURGERY Left 04/2016   face  . SHOULDER ARTHROSCOPY Left 03/1996    There were no vitals filed for this visit.   Subjective Assessment - 02/26/20 1446    Subjective Patient reports that she is doing well played tennis without issue.  Got results of MRI , showed mild DDD, not significant stenosis    Currently in Pain? Yes    Pain Score 2     Pain Location Neck    Pain Orientation Left                             [facility] Adult PT Treatment/Exercise - 02/26/20 0001      Exercises   Exercises Neck      Neck Exercises: Machines for Strengthening   Nustep level 5 x 5 minutes    Cybex Row 20# 2x10    Lat Pull 20# 2x10    Other Machines for Strengthening 25# triceps, 10# biceps    Other Machines for Strengthening calf stretches      Neck Exercises: Theraband   Shoulder Extension 20 reps;Red    Rows 20 reps;Red    Shoulder External Rotation 20 reps;Red      Neck Exercises:  Seated   Other Seated Exercise 5# bent over row, 3# bent over extension and 3# reverse flies      Moist Heat Therapy   Number Minutes Moist Heat 10 Minutes    Moist Heat Location Cervical;Shoulder      Electrical Stimulation   Electrical Stimulation Location left upper trap and left neck area    Electrical Stimulation Action IFC    Electrical Stimulation Parameters supine    Electrical Stimulation Goals Pain                  PT Education - 02/26/20 1529    Education Details bent over row 5#, bent over extension 3#, bent over reverse flies 3#    Person(s) Educated Patient    Methods Explanation;Demonstration;Verbal cues;Tactile cues;Handout    Comprehension Verbalized understanding;Returned demonstration;Verbal cues required;Tactile cues required            PT Short Term Goals - 02/13/20 1523      PT SHORT TERM GOAL #1   Title independent with initial HEP  PT Long Term Goals - 02/26/20 1532      PT LONG TERM GOAL #2   Title report 50% less pain with sleep    Status Partially Met      PT LONG TERM GOAL #3   Title report no difficulty lifting grand child    Status Partially Met                 Plan - 02/26/20 1530    Clinical Impression Statement Patient is doing much better overall, we really added exercises and focused on strength of the scapular area and the shoulder area to support he neck, she needed cues for posture and form, she did have some shaking with eccentric part of the exercises showing weakness    PT Next Visit Plan see how the exercises did    Consulted and Agree with Plan of Care Patient           Patient will benefit from skilled therapeutic intervention in order to improve the following deficits and impairments:  Impaired tone, Increased muscle spasms, Impaired UE functional use, Pain, Postural dysfunction, Decreased strength, Impaired flexibility  Visit Diagnosis: Acute pain of left shoulder  Cramp and  spasm  Cervicalgia     Problem List Patient Active Problem List   Diagnosis Date Noted  . Polyarthralgia 02/07/2018  . Low back pain 02/07/2018  . Acute left-sided thoracic back pain 11/30/2016  . Atypical chest pain 02/14/2015  . White coat hypertension 07/18/2014  . Routine general medical examination at a health care facility 07/18/2014    Sumner Boast., PT 02/26/2020, 3:33 PM  [place]. [place], [place], [address] Phone: [phone]   Fax:  [phone]  Name: Elizabeth Moses MRN: [id number] Date of Birth: [birth year]

## 2020-03-03 ENCOUNTER — Encounter: Payer: Medicare PPO | Admitting: Physical Therapy

## 2020-03-09 ENCOUNTER — Ambulatory Visit: Payer: Medicare PPO | Attending: Internal Medicine

## 2020-03-09 DIAGNOSIS — Z23 Encounter for immunization: Secondary | ICD-10-CM

## 2020-03-09 NOTE — Progress Notes (Signed)
   Covid-19 Vaccination Clinic  Name:  AYN DOMANGUE    MRN: [id number] DOB: 1946-01-26  03/09/2020  Ms. Kerchner was observed post Covid-19 immunization for 15 minutes without incident. She was provided with Vaccine Information Sheet and instruction to access the V-Safe system.   Ms. Hilgeman was instructed to call 911 with any severe reactions post vaccine: Marland Kitchen Difficulty breathing  . Swelling of face and throat  . A fast heartbeat  . A bad rash all over body  . Dizziness and weakness   Immunizations Administered    Name Date Dose VIS Date Route   Pfizer COVID-19 Vaccine 03/09/2020 12:45 PM 0.3 mL 02/13/2020 Intramuscular   Manufacturer: [employer], [employer]   Lot: [id number]   NDC: [address]-4

## 2020-03-11 ENCOUNTER — Ambulatory Visit: Payer: Medicare PPO | Admitting: Physical Therapy

## 2020-03-11 ENCOUNTER — Other Ambulatory Visit: Payer: Self-pay

## 2020-03-11 ENCOUNTER — Encounter: Payer: Self-pay | Admitting: Physical Therapy

## 2020-03-11 ENCOUNTER — Encounter: Payer: Self-pay | Admitting: Obstetrics & Gynecology

## 2020-03-11 DIAGNOSIS — M25512 Pain in left shoulder: Secondary | ICD-10-CM

## 2020-03-11 DIAGNOSIS — M542 Cervicalgia: Secondary | ICD-10-CM

## 2020-03-11 DIAGNOSIS — R252 Cramp and spasm: Secondary | ICD-10-CM

## 2020-03-11 NOTE — Therapy (Signed)
[facility] [place] [facility] [address]. [place], [place], [address] Phone: [phone]   Fax:  [phone]  Physical Therapy Treatment  Patient Details  Name: Elizabeth Moses MRN: [id number] Date of Birth: [birth year] Referring Provider (PT): Cheron Every Date: 03/11/2020   PT End of Session - 03/11/20 1435    Visit Number 6    Date for PT Re-Evaluation 03/29/20    PT Start Time 1353    PT Stop Time 1442    PT Time Calculation (min) 49 min    Activity Tolerance Patient tolerated treatment well    Behavior During Therapy [facility] for tasks assessed/performed           Past Medical History:  Diagnosis Date  . History of blood transfusion    As a child from father    Past Surgical History:  Procedure Laterality Date  . COMBINED HYSTEROSCOPY DIAGNOSTIC / D&C  08/1989   Benign  . KNEE ARTHROSCOPY Right 01/2003  . KNEE ARTHROSCOPY Left 08/2003  . MANDIBLE SURGERY  06/1989   Reconstructive  . MOHS SURGERY  12/13   on forehead  . MOHS SURGERY Left 04/2016   face  . SHOULDER ARTHROSCOPY Left 03/1996    There were no vitals filed for this visit.   Subjective Assessment - 03/11/20 1354    Subjective I am really doing well picked up great grandchild and felt a little bet but other than that doing well, playing tennis    Currently in Pain? Yes    Pain Score 1     Pain Location Shoulder    Pain Orientation Left    Pain Descriptors / Indicators Tightness;Spasm    Aggravating Factors  lifting grandchild                             [facility] Adult PT Treatment/Exercise - 03/11/20 0001      Neck Exercises: Machines for Strengthening   Nustep level 5 x 5 minutes    Other Machines for Strengthening green tbands triceps, biceps, chest press    Other Machines for Strengthening door stretch      Neck Exercises: Theraband   Shoulder Extension 20 reps;Green    Rows 20 reps;Green    Shoulder External Rotation 20  reps;Green      Neck Exercises: Seated   Other Seated Exercise 5# bent over row, 3# bent over extension and 3# reverse flies                  PT Education - 03/11/20 1434    Education Details Went over HEP with green TB, posture, TENS, lifting body mechanics etc...    Person(s) Educated Patient    Methods Explanation;Demonstration;Tactile cues;Verbal cues;Handout    Comprehension Verbalized understanding;Returned demonstration;Verbal cues required;Tactile cues required            PT Short Term Goals - 02/13/20 1523      PT SHORT TERM GOAL #1   Title independent with initial HEP             PT Long Term Goals - 03/11/20 1441      PT LONG TERM GOAL #1   Title understadn posture and body mechanics    Status Achieved      PT LONG TERM GOAL #2   Title report 50% less pain with sleep    Status Achieved      PT LONG TERM GOAL #  3   Title report no difficulty lifting grand child    Status Achieved      PT LONG TERM GOAL #4   Title independent and safe with advanced HEP    Status Achieved                 Plan - 03/11/20 1436    Clinical Impression Statement Patient reports that she is doing very well, has questions about HEP.  Went over all quesitons, cues needed for HEP for her to go slower and really get scapular retraction.  We talked aobut posture and body mechanics, we talked about TENs and how she could get waway from the HEP in the future but always return if she has pain    PT Next Visit Plan D/C    Consulted and Agree with Plan of Care Patient           Patient will benefit from skilled therapeutic intervention in order to improve the following deficits and impairments:  Impaired tone, Increased muscle spasms, Impaired UE functional use, Pain, Postural dysfunction, Decreased strength, Impaired flexibility  Visit Diagnosis: Acute pain of left shoulder  Cramp and spasm  Cervicalgia     Problem List Patient Active Problem List    Diagnosis Date Noted  . Polyarthralgia 02/07/2018  . Low back pain 02/07/2018  . Acute left-sided thoracic back pain 11/30/2016  . Atypical chest pain 02/14/2015  . White coat hypertension 07/18/2014  . Routine general medical examination at a health care facility 07/18/2014    Jearld Lesch., PT 03/11/2020, 2:42 PM  [facility] [place] [facility] [address]. [place], [place], [address] Phone: [phone]   Fax:  [phone]  Name: Elizabeth Moses MRN: [id number] Date of Birth: Apr 08, 1946

## 2020-03-17 ENCOUNTER — Ambulatory Visit: Payer: Medicare PPO | Admitting: Physical Therapy

## 2020-03-24 ENCOUNTER — Ambulatory Visit: Payer: Medicare PPO | Admitting: Physical Therapy

## 2020-04-24 ENCOUNTER — Emergency Department (HOSPITAL_COMMUNITY)
Admission: EM | Admit: 2020-04-24 | Discharge: 2020-04-25 | Disposition: A | Discharge status: Home / Self Care | Payer: Medicare PPO | Attending: Emergency Medicine | Admitting: Emergency Medicine

## 2020-04-24 ENCOUNTER — Other Ambulatory Visit: Payer: Self-pay

## 2020-04-24 ENCOUNTER — Telehealth: Payer: Self-pay | Admitting: Internal Medicine

## 2020-04-24 ENCOUNTER — Encounter (HOSPITAL_COMMUNITY): Payer: Self-pay | Admitting: Emergency Medicine

## 2020-04-24 DIAGNOSIS — R12 Heartburn: Secondary | ICD-10-CM | POA: Diagnosis not present

## 2020-04-24 DIAGNOSIS — R072 Precordial pain: Secondary | ICD-10-CM | POA: Diagnosis present

## 2020-04-24 DIAGNOSIS — R079 Chest pain, unspecified: Secondary | ICD-10-CM | POA: Diagnosis not present

## 2020-04-24 LAB — CBC
HCT: 38.7 % (ref 36.0–46.0)
Hemoglobin: 13.3 g/dL (ref 12.0–15.0)
MCH: 30.9 pg (ref 26.0–34.0)
MCHC: 34.4 g/dL (ref 30.0–36.0)
MCV: 89.8 fL (ref 80.0–100.0)
Platelets: 296 10*3/uL (ref 150–400)
RBC: 4.31 MIL/uL (ref 3.87–5.11)
RDW: 12.7 % (ref 11.5–15.5)
WBC: 7.1 10*3/uL (ref 4.0–10.5)
nRBC: 0 % (ref 0.0–0.2)

## 2020-04-24 LAB — BASIC METABOLIC PANEL
Anion gap: 13 (ref 5–15)
BUN: 21 mg/dL (ref 8–23)
CO2: 25 mmol/L (ref 22–32)
Calcium: 10.1 mg/dL (ref 8.9–10.3)
Chloride: 101 mmol/L (ref 98–111)
Creatinine, Ser: 0.88 mg/dL (ref 0.44–1.00)
GFR, Estimated: >60 mL/min (ref >60)
Glucose, Bld: 95 mg/dL (ref 70–99)
Potassium: 3.7 mmol/L (ref 3.5–5.1)
Sodium: 139 mmol/L (ref 135–145)

## 2020-04-24 LAB — TROPONIN I (HIGH SENSITIVITY)
Troponin I (High Sensitivity): 4 ng/L (ref <18)
Troponin I (High Sensitivity): 5 ng/L (ref <18)

## 2020-04-24 NOTE — ED Triage Notes (Signed)
Patient complains of 5/10 chest pain that started three days. Patient states her pain was 9/10 at 1500, received 1x SL NTG and 324mg  ASA with EMS PTA and pain reduced to 5/10. Patient states pain gets worse she is at rest.

## 2020-04-24 NOTE — Telephone Encounter (Signed)
[facility] FYI  Caller states his wife started to feel pain around her breast bone area. States she is now feeling some pressure in her back. States it feels like someone has hit her in the back and the only time she gets relief is if she stands. States she has some acid reflux symptoms as well. States it began 3 days ago.  [facility] advised: Call EMS 911 now  Attempted to call patient back to verify if they were able to get in touch with 911, no answer.    Patient understood and complied.

## 2020-04-24 NOTE — Telephone Encounter (Signed)
Patients husband called and said a few days ago the patient started to feel pain around her breat bone area, pressure in the middle of her back, she said it feels like someone hit her in the back. She said that pain is in the center of her back. The only way she feels a little better is while standing, said that it hurts worse when sitting down. Transferred to team [facility].

## 2020-04-25 ENCOUNTER — Emergency Department (HOSPITAL_COMMUNITY): Payer: Medicare PPO

## 2020-04-25 LAB — HEPATIC FUNCTION PANEL
ALT: 18 U/L (ref 0–44)
AST: 28 U/L (ref 15–41)
Albumin: 3.8 g/dL (ref 3.5–5.0)
Alkaline Phosphatase: 74 U/L (ref 38–126)
Bilirubin, Direct: <0.1 mg/dL (ref 0.0–0.2)
Total Bilirubin: 0.8 mg/dL (ref 0.3–1.2)
Total Protein: 7.2 g/dL (ref 6.5–8.1)

## 2020-04-25 LAB — LIPASE, BLOOD: Lipase: 22 U/L (ref 11–51)

## 2020-04-25 MED ORDER — LIDOCAINE VISCOUS HCL 2 % MT SOLN
15.0000 mL | Freq: Once | OROMUCOSAL | Status: AC
  Administered 2020-04-25: 15 mL via ORAL
  Filled 2020-04-25: qty 15

## 2020-04-25 MED ORDER — LIDOCAINE VISCOUS HCL 2 % MT SOLN: 15.0000 mL | Freq: Four times a day (QID) | OROMUCOSAL | 0 refills | Status: DC | PRN

## 2020-04-25 MED ORDER — ALUM & MAG HYDROXIDE-SIMETH 200-200-20 MG/5ML PO SUSP
30.0000 mL | Freq: Once | ORAL | Status: AC
  Administered 2020-04-25: 30 mL via ORAL
  Filled 2020-04-25: qty 30

## 2020-04-25 NOTE — ED Provider Notes (Signed)
[facility] EMERGENCY DEPARTMENT Provider Note  CSN: [id number] Arrival date & time: 04/24/20 1553  Chief Complaint(s) Chest Pain  HPI Elizabeth Moses is a 74 y.o. female    Chest Pain Pain location:  Substernal area and epigastric Pain quality: aching   Pain radiates to:  Upper back Pain severity:  Severe Onset quality:  Gradual Duration:  3 days Timing:  Sporadic Progression:  Waxing and waning Chronicity:  New Context: eating   Relieved by:  Antacids Exacerbated by: eating. Associated symptoms: heartburn   Associated symptoms: no anxiety, no cough, no fatigue, no fever, no headache, no nausea, no shortness of breath and no vomiting   Risk factors: no high cholesterol, no hypertension, no prior DVT/PE and no smoking     Past Medical History Past Medical History:  Diagnosis Date  . History of blood transfusion    As a child from father   Patient Active Problem List   Diagnosis Date Noted  . Polyarthralgia 02/07/2018  . Low back pain 02/07/2018  . Acute left-sided thoracic back pain 11/30/2016  . Atypical chest pain 02/14/2015  . White coat hypertension 07/18/2014  . Routine general medical examination at a health care facility 07/18/2014   Home Medication(s) Prior to Admission medications   Medication Sig Start Date End Date Taking? Authorizing Provider  lidocaine (XYLOCAINE) 2 % solution Use as directed 15 mLs in the mouth or throat every 6 (six) hours as needed for mouth pain. 04/25/20  Yes Ashawnti Tangen, Amadeo Garnet, MD  Cholecalciferol (VITAMIN D-3) 25 MCG (1000 UT) CAPS Take 1 capsule by mouth daily.     [provider]  estradiol (ESTRACE) 0.1 MG/GM vaginal cream Apply a small amount as instructed two to three times weekly Patient not taking: Reported on 02/25/2020 11/19/19   Jerene Bears, MD  fluorouracil (EFUDEX) 5 % cream  06/06/19   [provider]  gabapentin (NEURONTIN) 100 MG capsule Take 100 mg by mouth 3 (three)  times daily.    [provider]  Misc Natural Products (TART CHERRY ADVANCED PO) Take by mouth. Patient not taking: Reported on 02/25/2020    [provider]  Multiple Vitamin (MULTIVITAMIN PO) Take by mouth. 2 gummies    [provider]  TURMERIC PO Take by mouth.    [provider]                                                                                                                                    Past Surgical History Past Surgical History:  Procedure Laterality Date  . COMBINED HYSTEROSCOPY DIAGNOSTIC / D&C  08/1989   Benign  . KNEE ARTHROSCOPY Right 01/2003  . KNEE ARTHROSCOPY Left 08/2003  . MANDIBLE SURGERY  06/1989   Reconstructive  . MOHS SURGERY  12/13   on forehead  . MOHS SURGERY Left 04/2016   face  . SHOULDER ARTHROSCOPY Left 03/1996  Family History Family History  Problem Relation Age of Onset  . Heart disease Mother   . Hypertension Mother   . Thyroid disease Mother   . Heart disease Father   . Epilepsy Brother   . Heart disease Maternal Grandfather 81  . Heart disease Paternal Grandfather 84  . Other Sister        breast biopsy  . Osteoporosis Sister     Social History Social History   Tobacco Use  . Smoking status: Never Smoker  . Smokeless tobacco: Never Used  Vaping Use  . Vaping Use: Never used  Substance Use Topics  . Alcohol use: No  . Drug use: No   Allergies Lodine [etodolac] and Penicillins  Review of Systems Review of Systems  Constitutional: Negative for fatigue and fever.  Respiratory: Negative for cough and shortness of breath.   Cardiovascular: Positive for chest pain.  Gastrointestinal: Positive for heartburn. Negative for nausea and vomiting.  Neurological: Negative for headaches.   All other systems are reviewed and are negative for acute change except as noted in the HPI  Physical Exam Vital Signs  I have reviewed the triage vital signs BP (!) 170/71   Pulse 60   Temp  98.3 F (36.8 C) (Oral)   Resp 13   Ht 5\' 6"  (1.676 m)   Wt 71.2 kg   LMP 04/27/1995   SpO2 99%   BMI 25.34 kg/m   Physical Exam Vitals reviewed.  Constitutional:      General: She is not in acute distress.    Appearance: She is well-developed and well-nourished. She is not diaphoretic.  HENT:     Head: Normocephalic and atraumatic.     Nose: Nose normal.  Eyes:     General: No scleral icterus.       Right eye: No discharge.        Left eye: No discharge.     Extraocular Movements: EOM normal.     Conjunctiva/sclera: Conjunctivae normal.     Pupils: Pupils are equal, round, and reactive to light.  Cardiovascular:     Rate and Rhythm: Normal rate and regular rhythm.     Heart sounds: No murmur heard. No friction rub. No gallop.   Pulmonary:     Effort: Pulmonary effort is normal. No respiratory distress.     Breath sounds: Normal breath sounds. No stridor. No rales.  Abdominal:     General: There is no distension.     Palpations: Abdomen is soft.     Tenderness: There is no abdominal tenderness.  Musculoskeletal:        General: No tenderness or edema.     Cervical back: Normal range of motion and neck supple.  Skin:    General: Skin is warm and dry.     Findings: No erythema or rash.  Neurological:     Mental Status: She is alert and oriented to person, place, and time.  Psychiatric:        Mood and Affect: Mood and affect normal.     ED Results and Treatments Labs (all labs ordered are listed, but only abnormal results are displayed) Labs Reviewed  BASIC METABOLIC PANEL  CBC  LIPASE, BLOOD  HEPATIC FUNCTION PANEL  TROPONIN I (HIGH SENSITIVITY)  TROPONIN I (HIGH SENSITIVITY)  EKG  EKG Interpretation  Date/Time:  Thursday April 24 2020 15:48:26 EST Ventricular Rate:  77 PR Interval:  154 QRS Duration: 82 QT Interval:  398 QTC  Calculation: 450 R Axis:   80 Text Interpretation: Normal sinus rhythm Normal ECG NO STEMI. No old tracing to compare Confirmed by Drema Pryardama, Kingsley Farace [phone]([id number]) on 04/25/2020 12:07:02 AM      Radiology DG Chest 2 View  Result Date: 04/25/2020 CLINICAL DATA:  Chest pain EXAM: CHEST - 2 VIEW COMPARISON:  None. FINDINGS: The heart size and mediastinal contours are within normal limits. Both lungs are clear. The visualized skeletal structures are unremarkable. IMPRESSION: No active cardiopulmonary disease. Electronically Signed   By: Helyn NumbersAshesh  Parikh MD   On: 04/25/2020 00:44    Pertinent labs & imaging results that were available during my care of the patient were reviewed by me and considered in my medical decision making (see chart for details).  Medications Ordered in ED Medications  alum & mag hydroxide-simeth (MAALOX/MYLANTA) 200-200-20 MG/5ML suspension 30 mL (30 mLs Oral Given 04/25/20 0049)    And  lidocaine (XYLOCAINE) 2 % viscous mouth solution 15 mL (15 mLs Oral Given 04/25/20 0049)                                                                                                                                    Procedures Procedures  (including critical care time)  Medical Decision Making / ED Course I have reviewed the nursing notes for this encounter and the patient's prior records (if available in EHR or on provided paperwork).   Julieanne CottonGeraldine B Brogan was evaluated in Emergency Department on 04/25/2020 for the symptoms described in the history of present illness. She was evaluated in the context of the global COVID-19 pandemic, which necessitated consideration that the patient might be at risk for infection with the SARS-CoV-2 virus that causes COVID-19. Institutional protocols and algorithms that pertain to the evaluation of patients at risk for COVID-19 are in a state of rapid change based on information released by regulatory bodies including the CDC and federal and state  organizations. These policies and algorithms were followed during the patient's care in the ED.  Atypical chest pain inconsistent with ACS. EKG without acute ischemic changes or evidence of pericarditis. Serial troponins negative x2.  Presentation is most consistent with GI etiology -likely GERD.  Low suspicion for pulmonary embolism.  Presentation not classic for aortic dissection or esophageal perforation.  Chest x-ray without evidence suggestive of pneumonia, pneumothorax, pneumomediastinum.  No abnormal contour of the mediastinum to suggest dissection. No evidence of acute injuries.  Labs grossly reassuring without leukocytosis or anemia.  No significant electrolyte derangements or renal sufficiency.  No evidence of bili obstruction or pancreatitis.  Patient treated with GI cocktail resulting in improved symptoms.       Final Clinical Impression(s) / ED Diagnoses Final diagnoses:  Chest pain   The patient appears reasonably  screened and/or stabilized for discharge and I doubt any other medical condition or other [facility] requiring further screening, evaluation, or treatment in the ED at this time prior to discharge. Safe for discharge with strict return precautions.  Disposition: Discharge  Condition: Good  I have discussed the results, Dx and Tx plan with the patient/family who expressed understanding and agree(s) with the plan. Discharge instructions discussed at length. The patient/family was given strict return precautions who verbalized understanding of the instructions. No further questions at time of discharge.    ED Discharge Orders         Ordered    lidocaine (XYLOCAINE) 2 % solution  Every 6 hours PRN        04/25/20 0243           Follow Up: Myrlene Broker, MD [address] [place] [address] [phone]  Call  To schedule an appointment for close follow up      This chart was dictated using voice recognition software.  Despite best  efforts to proofread,  errors can occur which can change the documentation meaning.   Nira Conn, MD 04/25/20 [phone]

## 2020-04-29 ENCOUNTER — Ambulatory Visit: Payer: Medicare PPO | Admitting: Internal Medicine

## 2020-05-07 ENCOUNTER — Ambulatory Visit: Payer: Medicare PPO | Admitting: Internal Medicine

## 2020-05-07 ENCOUNTER — Encounter: Payer: Self-pay | Admitting: Internal Medicine

## 2020-05-07 ENCOUNTER — Other Ambulatory Visit: Payer: Self-pay

## 2020-05-07 DIAGNOSIS — K219 Gastro-esophageal reflux disease without esophagitis: Secondary | ICD-10-CM | POA: Diagnosis not present

## 2020-05-07 NOTE — Progress Notes (Signed)
   Subjective:   Patient ID: Elizabeth Moses, female    DOB: 14-Jun-1945, 75 y.o.   MRN: [id number]  HPI Taken lidocaine liquid twice since ER and taking omeprazole BID and feeling some relief. Some sensation in the throat of fullness, no problems with swallowing. Denies food getting stuck. Denies weight loss or change. Is feeling mostly better since omeprazole BID for 2 weeks. Has been eating bland diet without any foods advised to avoid by ER and wants to know how long she needs to restrict her diet so much.   PMH, [facility], social history reviewed and updated  Review of Systems  Constitutional: Negative.   HENT: Negative.   Eyes: Negative.   Respiratory: Negative for cough, chest tightness and shortness of breath.   Cardiovascular: Negative for chest pain, palpitations and leg swelling.  Gastrointestinal: Negative for abdominal distention, abdominal pain, constipation, diarrhea, nausea and vomiting.       GERD, throat fullness  Musculoskeletal: Negative.   Skin: Negative.   Neurological: Negative.   Psychiatric/Behavioral: Negative.     Objective:  Physical Exam Constitutional:      Appearance: She is well-developed and well-nourished.  HENT:     Head: Normocephalic and atraumatic.  Eyes:     Extraocular Movements: EOM normal.  Cardiovascular:     Rate and Rhythm: Normal rate and regular rhythm.  Pulmonary:     Effort: Pulmonary effort is normal. No respiratory distress.     Breath sounds: Normal breath sounds. No wheezing or rales.  Abdominal:     General: Bowel sounds are normal. There is no distension.     Palpations: Abdomen is soft.     Tenderness: There is no abdominal tenderness. There is no rebound.  Musculoskeletal:        General: No edema.     Cervical back: Normal range of motion.  Skin:    General: Skin is warm and dry.  Neurological:     Mental Status: She is alert and oriented to person, place, and time.     Coordination: Coordination normal.   Psychiatric:        Mood and Affect: Mood and affect normal.     Vitals:   05/07/20 0919  BP: (!) 178/90  Pulse: 90  Temp: 98.2 F (36.8 C)  TempSrc: Oral  SpO2: 99%  Weight: 155 lb (70.3 kg)  Height: 5\' 6"  (1.676 m)    This visit occurred during the SARS-CoV-2 public health emergency.  Safety protocols were in place, including screening questions prior to the visit, additional usage of staff PPE, and extensive cleaning of exam room while observing appropriate contact time as indicated for disinfecting solutions.   Assessment & Plan:

## 2020-05-07 NOTE — Patient Instructions (Addendum)
We will have you go down to one omeprazole daily in the morning before breakfast. Do this for 1-2 weeks then stop the omeprazole. If you get the symptoms back go back to 1 pill daily omeprazole and let us know.

## 2020-05-09 ENCOUNTER — Encounter: Payer: Self-pay | Admitting: Internal Medicine

## 2020-05-09 DIAGNOSIS — K219 Gastro-esophageal reflux disease without esophagitis: Secondary | ICD-10-CM | POA: Insufficient documentation

## 2020-05-09 NOTE — Assessment & Plan Note (Signed)
She will reduce to omeprazole 20 mg daily for 1-2 weeks then stop. If recurrent symptoms resume omeprazole 20 mg daily. Okay to liberalize diet as tolerated.

## 2020-06-02 ENCOUNTER — Other Ambulatory Visit: Payer: Self-pay

## 2020-06-02 ENCOUNTER — Ambulatory Visit: Payer: Medicare PPO | Admitting: Internal Medicine

## 2020-06-02 ENCOUNTER — Encounter: Payer: Self-pay | Admitting: Internal Medicine

## 2020-06-02 VITALS — BP 134/78 | HR 80 | Temp 98.1°F | Resp 18 | Ht 66.0 in | Wt 157.6 lb

## 2020-06-02 DIAGNOSIS — R030 Elevated blood-pressure reading, without diagnosis of hypertension: Secondary | ICD-10-CM

## 2020-06-02 DIAGNOSIS — M545 Low back pain, unspecified: Secondary | ICD-10-CM

## 2020-06-02 DIAGNOSIS — Z0000 Encounter for general adult medical examination without abnormal findings: Secondary | ICD-10-CM | POA: Diagnosis not present

## 2020-06-02 DIAGNOSIS — K219 Gastro-esophageal reflux disease without esophagitis: Secondary | ICD-10-CM | POA: Diagnosis not present

## 2020-06-02 DIAGNOSIS — G8929 Other chronic pain: Secondary | ICD-10-CM

## 2020-06-02 MED ORDER — GABAPENTIN 100 MG PO CAPS
100.0000 mg | ORAL_CAPSULE | Freq: Three times a day (TID) | ORAL | 3 refills | Status: DC
Start: 2020-06-02 — End: 2022-08-23

## 2020-06-02 NOTE — Patient Instructions (Addendum)
You can keep taking 2 pills of the omeprazole daily to prevent symptoms.   We can send in a prescription for pepcid (famotidine) once you finish that.   We have sent in the refill of the gabapentin.

## 2020-06-02 NOTE — Assessment & Plan Note (Signed)
Continue omeprazole 40 mg daily. Once out she may try pepcid 40 mg daily instead to see if this helps also.

## 2020-06-02 NOTE — Progress Notes (Signed)
   Subjective:   Patient ID: Elizabeth Moses, female    DOB: February 20, 1946, 75 y.o.   MRN: [id number]  HPI The patient is a 75 YO female coming in for physical and some questions about GERD.  PMH, [facility], social history reviewed and updated  Review of Systems  Constitutional: Negative.   HENT: Negative.   Eyes: Negative.   Respiratory: Negative for cough, chest tightness and shortness of breath.   Cardiovascular: Negative for chest pain, palpitations and leg swelling.  Gastrointestinal: Negative for abdominal distention, abdominal pain, constipation, diarrhea, nausea and vomiting.       Gerd  Musculoskeletal: Negative.   Skin: Negative.   Neurological: Negative.   Psychiatric/Behavioral: Negative.     Objective:  Physical Exam Constitutional:      Appearance: She is well-developed and well-nourished.  HENT:     Head: Normocephalic and atraumatic.  Eyes:     Extraocular Movements: EOM normal.  Cardiovascular:     Rate and Rhythm: Normal rate and regular rhythm.  Pulmonary:     Effort: Pulmonary effort is normal. No respiratory distress.     Breath sounds: Normal breath sounds. No wheezing or rales.  Abdominal:     General: Bowel sounds are normal. There is no distension.     Palpations: Abdomen is soft.     Tenderness: There is no abdominal tenderness. There is no rebound.  Musculoskeletal:        General: No edema.     Cervical back: Normal range of motion.  Skin:    General: Skin is warm and dry.  Neurological:     Mental Status: She is alert and oriented to person, place, and time.     Coordination: Coordination normal.  Psychiatric:        Mood and Affect: Mood and affect normal.     Vitals:   06/02/20 1328  BP: 134/78  Pulse: 80  Resp: 18  Temp: 98.1 F (36.7 C)  TempSrc: Oral  SpO2: 98%  Weight: 157 lb 9.6 oz (71.5 kg)  Height: 5\' 6"  (1.676 m)   This visit occurred during the SARS-CoV-2 public health emergency.  Safety protocols were in place,  including screening questions prior to the visit, additional usage of staff PPE, and extensive cleaning of exam room while observing appropriate contact time as indicated for disinfecting solutions.   Assessment & Plan:

## 2020-06-02 NOTE — Assessment & Plan Note (Signed)
BP at goal today but intermittently high in office with normal home readings.

## 2020-06-02 NOTE — Assessment & Plan Note (Signed)
Flu shot declines. Covid-19 up to date including booster. Pneumonia complete. Shingrix complete. Tetanus due 2023. Colonoscopy aged out of future. Mammogram due 2023, pap smear aged out and dexa done declines need for further. Counseled about sun safety and mole surveillance. Counseled about the dangers of distracted driving. Given 10 year screening recommendations.

## 2020-06-02 NOTE — Assessment & Plan Note (Signed)
Needs refill on gabapentin which is done today.

## 2020-06-10 ENCOUNTER — Ambulatory Visit: Payer: Medicare Other

## 2020-08-18 ENCOUNTER — Ambulatory Visit: Payer: Medicare PPO

## 2020-09-01 ENCOUNTER — Telehealth: Payer: Self-pay | Admitting: Internal Medicine

## 2020-09-01 NOTE — Telephone Encounter (Signed)
LVM for pt to rtn my call to schedule awv with nha . Please schedule if pt calls the office.  

## 2020-09-08 ENCOUNTER — Other Ambulatory Visit: Payer: Self-pay

## 2020-09-08 ENCOUNTER — Encounter (HOSPITAL_BASED_OUTPATIENT_CLINIC_OR_DEPARTMENT_OTHER): Payer: Self-pay | Admitting: Obstetrics & Gynecology

## 2020-09-08 ENCOUNTER — Ambulatory Visit (INDEPENDENT_AMBULATORY_CARE_PROVIDER_SITE_OTHER): Payer: Medicare PPO | Admitting: Obstetrics & Gynecology

## 2020-09-08 VITALS — BP 160/76 | HR 82 | Ht 64.5 in | Wt 155.0 lb

## 2020-09-08 DIAGNOSIS — M8580 Other specified disorders of bone density and structure, unspecified site: Secondary | ICD-10-CM | POA: Diagnosis not present

## 2020-09-08 DIAGNOSIS — Z658 Other specified problems related to psychosocial circumstances: Secondary | ICD-10-CM

## 2020-09-08 DIAGNOSIS — Z780 Asymptomatic menopausal state: Secondary | ICD-10-CM | POA: Diagnosis not present

## 2020-09-08 DIAGNOSIS — Z01419 Encounter for gynecological examination (general) (routine) without abnormal findings: Secondary | ICD-10-CM | POA: Diagnosis not present

## 2020-09-08 DIAGNOSIS — R922 Inconclusive mammogram: Secondary | ICD-10-CM

## 2020-09-08 NOTE — Progress Notes (Signed)
75 y.o. [id number] Married White or Caucasian female here for breast and pelvic exam.  Doing well.  Still helping with great grandson in [place].  He will go to school in the fall.  Has been doing this since he was six weeks old.  She's planning to play more tennis in the fall when he doesn't go back to help him this year.  H/o osteopenia on last BMD 2016.  Reviewed with pt.  She really does not want another one.  Does not want to be on any medication.  Aware that she does have some fracture risk with osteopenia and age.  Feel do not need to do test if not going to do anything with results.  Having more stressors.  She is having some issues with reflux.  Was seen in ER due to chest pains.  She didn't think she was having a heart attack but did really have some pain.  One grandson was in [place] with [place] military exit.  Had traumatic brain injury at airport.  Having PTSD symptoms as well.  Husband has retired but hasn't figured out what is next.  Still adjusting.    Denies vaginal bleeding.  Patient's last menstrual period was 04/27/1995.          Sexually active: No.  H/O STD:  no  Health Maintenance: PCP:  Dr. Okey Dupre.  Last wellness appt was 06/02/2020.  Note reviewed.  Did blood work at that appt: no.  Reviewed multiple labs from ER visit. Vaccines are up to date:  yes Colonoscopy:  cologuard 09/2019 (reviewed results) MMG:  02/04/2020 BMD:  -2.1  Pt really does not want to have another one of these done Last pap smear:  2019.   H/o abnormal pap smear:  no   reports that she has never smoked. She has never used smokeless tobacco. She reports that she does not drink alcohol and does not use drugs.  Past Medical History:  Diagnosis Date  . History of blood transfusion    As a child from father    Past Surgical History:  Procedure Laterality Date  . COMBINED HYSTEROSCOPY DIAGNOSTIC / D&C  08/1989   Benign  . KNEE ARTHROSCOPY Right 01/2003  . KNEE ARTHROSCOPY Left 08/2003  .  MANDIBLE SURGERY  06/1989   Reconstructive  . MOHS SURGERY  12/13   on forehead  . MOHS SURGERY Left 04/2016   face  . SHOULDER ARTHROSCOPY Left 03/1996    Current Outpatient Medications  Medication Sig Dispense Refill  . famotidine (PEPCID) 20 MG tablet Take 20 mg by mouth 2 (two) times daily.    Marland Kitchen gabapentin (NEURONTIN) 100 MG capsule Take 1 capsule (100 mg total) by mouth 3 (three) times daily. 270 capsule 3  . estradiol (ESTRACE) 0.1 MG/GM vaginal cream Apply a small amount as instructed two to three times weekly (Patient not taking: Reported on 09/08/2020) 42.5 g 1  . fluorouracil (EFUDEX) 5 % cream  (Patient not taking: Reported on 09/08/2020)    . lidocaine (XYLOCAINE) 2 % solution Use as directed 15 mLs in the mouth or throat every 6 (six) hours as needed for mouth pain. (Patient not taking: Reported on 09/08/2020) 100 mL 0  . omeprazole (PRILOSEC) 20 MG capsule Take 40 mg by mouth daily. (Patient not taking: Reported on 09/08/2020)    . TURMERIC PO Take by mouth. (Patient not taking: No sig reported)     No current facility-administered medications for this visit.    Family History  Problem Relation Age of Onset  . Heart disease Mother   . Hypertension Mother   . Thyroid disease Mother   . Heart disease Father   . Epilepsy Brother   . Heart disease Maternal Grandfather 55  . Heart disease Paternal Grandfather 8  . Other Sister        breast biopsy  . Osteoporosis Sister     Review of Systems  All other systems reviewed and are negative.   Exam:   BP (!) 160/76   Pulse 82   Ht 5' 4.5" (1.638 m)   Wt 155 lb (70.3 kg)   LMP 04/27/1995   BMI 26.19 kg/m   Height: 5' 4.5" (163.8 cm)  General appearance: alert, cooperative and appears stated age Breasts: normal appearance, no masses or tenderness Abdomen: soft, non-tender; bowel sounds normal; no masses,  no organomegaly Lymph nodes: Cervical, supraclavicular, and axillary nodes normal.  No abnormal inguinal nodes  palpated Neurologic: Grossly normal  Pelvic: External genitalia:  no lesions              Urethra:  normal appearing urethra with no masses, tenderness or lesions              Bartholins and Skenes: normal                 Vagina: normal appearing vagina with atrophic changes and no discharge, no lesions              Cervix: no lesions              Pap taken: No. Bimanual Exam:  Uterus:  normal size, contour, position, consistency, mobility, non-tender              Adnexa: normal adnexa and no mass, fullness, tenderness               Rectovaginal: Confirms               Anus:  normal sphincter tone, no lesions  Chaperone, Leodis Rains, CMA, was present for exam.  Assessment/Plan: 1. Encntr for gyn exam (general) (routine) w/o abn findings - pap smear not indicated due to age and hx - MMG 01/2020 - cologuard 09/2019.  Probably last one that will be needed - BMD with osteopenia in 2016.  Declines having another BMD.  Does not want to be on any medication - PCP Dr. Okey Dupre.  Lab work done with her. - vaccines updated - pt is going to return in 2 years or sooner with any new issues/concerns.  2. Postmenopausal - no HRT  3. Breast density - still doing yearly MMG.  Pt does not want to stop.  4.  Osteopenia  5.  Psychosocial stressors - does not need any medication or treatment at this time

## 2020-12-31 DIAGNOSIS — Q828 Other specified congenital malformations of skin: Secondary | ICD-10-CM | POA: Diagnosis not present

## 2020-12-31 DIAGNOSIS — Z85828 Personal history of other malignant neoplasm of skin: Secondary | ICD-10-CM | POA: Diagnosis not present

## 2020-12-31 DIAGNOSIS — L570 Actinic keratosis: Secondary | ICD-10-CM | POA: Diagnosis not present

## 2020-12-31 DIAGNOSIS — D485 Neoplasm of uncertain behavior of skin: Secondary | ICD-10-CM | POA: Diagnosis not present

## 2021-01-20 ENCOUNTER — Telehealth: Payer: Medicare PPO | Admitting: Family

## 2021-01-20 DIAGNOSIS — L255 Unspecified contact dermatitis due to plants, except food: Secondary | ICD-10-CM | POA: Diagnosis not present

## 2021-02-10 DIAGNOSIS — Z1231 Encounter for screening mammogram for malignant neoplasm of breast: Secondary | ICD-10-CM | POA: Diagnosis not present

## 2021-02-13 ENCOUNTER — Encounter (HOSPITAL_BASED_OUTPATIENT_CLINIC_OR_DEPARTMENT_OTHER): Payer: Self-pay | Admitting: Obstetrics & Gynecology

## 2021-05-17 DIAGNOSIS — H5203 Hypermetropia, bilateral: Secondary | ICD-10-CM | POA: Diagnosis not present

## 2021-05-19 DIAGNOSIS — Z85828 Personal history of other malignant neoplasm of skin: Secondary | ICD-10-CM | POA: Diagnosis not present

## 2021-05-19 DIAGNOSIS — L570 Actinic keratosis: Secondary | ICD-10-CM | POA: Diagnosis not present

## 2021-05-19 DIAGNOSIS — L814 Other melanin hyperpigmentation: Secondary | ICD-10-CM | POA: Diagnosis not present

## 2021-05-19 DIAGNOSIS — L565 Disseminated superficial actinic porokeratosis (DSAP): Secondary | ICD-10-CM | POA: Diagnosis not present

## 2021-05-19 DIAGNOSIS — L218 Other seborrheic dermatitis: Secondary | ICD-10-CM | POA: Diagnosis not present

## 2021-05-19 DIAGNOSIS — L821 Other seborrheic keratosis: Secondary | ICD-10-CM | POA: Diagnosis not present

## 2021-08-20 ENCOUNTER — Ambulatory Visit (INDEPENDENT_AMBULATORY_CARE_PROVIDER_SITE_OTHER): Payer: Medicare PPO | Admitting: Internal Medicine

## 2021-08-20 ENCOUNTER — Encounter: Payer: Self-pay | Admitting: Internal Medicine

## 2021-08-20 VITALS — BP 122/70 | HR 75 | Resp 18 | Ht 64.5 in | Wt 145.4 lb

## 2021-08-20 DIAGNOSIS — Z9189 Other specified personal risk factors, not elsewhere classified: Secondary | ICD-10-CM | POA: Diagnosis not present

## 2021-08-20 DIAGNOSIS — Z0000 Encounter for general adult medical examination without abnormal findings: Secondary | ICD-10-CM | POA: Diagnosis not present

## 2021-08-20 DIAGNOSIS — R030 Elevated blood-pressure reading, without diagnosis of hypertension: Secondary | ICD-10-CM | POA: Diagnosis not present

## 2021-08-20 LAB — COMPREHENSIVE METABOLIC PANEL
ALT: 12 U/L (ref 0–35)
AST: 24 U/L (ref 0–37)
Albumin: 4.5 g/dL (ref 3.5–5.2)
Alkaline Phosphatase: 71 U/L (ref 39–117)
BUN: 18 mg/dL (ref 6–23)
CO2: 29 mEq/L (ref 19–32)
Calcium: 9.5 mg/dL (ref 8.4–10.5)
Chloride: 103 mEq/L (ref 96–112)
Creatinine, Ser: 0.95 mg/dL (ref 0.40–1.20)
GFR: 57.5 mL/min — ABNORMAL LOW (ref >60.00)
Glucose, Bld: 91 mg/dL (ref 70–99)
Potassium: 4.2 mEq/L (ref 3.5–5.1)
Sodium: 138 mEq/L (ref 135–145)
Total Bilirubin: 0.6 mg/dL (ref 0.2–1.2)
Total Protein: 7.4 g/dL (ref 6.0–8.3)

## 2021-08-20 LAB — LIPID PANEL
Cholesterol: 200 mg/dL (ref 0–200)
HDL: 63.5 mg/dL (ref >39.00)
LDL Cholesterol: 125 mg/dL — ABNORMAL HIGH (ref 0–99)
NonHDL: 136.28
Total CHOL/HDL Ratio: 3
Triglycerides: 58 mg/dL (ref 0.0–149.0)
VLDL: 11.6 mg/dL (ref 0.0–40.0)

## 2021-08-20 LAB — CBC
HCT: 41.9 % (ref 36.0–46.0)
Hemoglobin: 13.9 g/dL (ref 12.0–15.0)
MCHC: 33.1 g/dL (ref 30.0–36.0)
MCV: 90.3 fl (ref 78.0–100.0)
Platelets: 234 10*3/uL (ref 150.0–400.0)
RBC: 4.65 Mil/uL (ref 3.87–5.11)
RDW: 12.7 % (ref 11.5–15.5)
WBC: 4.6 10*3/uL (ref 4.0–10.5)

## 2021-08-20 LAB — TSH: TSH: 4.15 u[IU]/mL (ref 0.35–5.50)

## 2021-08-20 NOTE — Patient Instructions (Signed)
We will get the calcium score ordered and do the blood work today. ?

## 2021-08-20 NOTE — Progress Notes (Signed)
? ?Subjective:  ? ?Patient ID: Elizabeth Moses, female    DOB: 06/21/1945, 76 y.o.   MRN: [id number] ? ?HPI ?Here for medicare wellness and physical, no new complaints. Please see A/P for status and treatment of chronic medical problems.  ? ?Diet: heart healthy ?Physical activity: active, gardening, tennis ?Depression/mood screen: negative ?Hearing: intact to whispered voice, mild loss bilaterally ?Visual acuity: grossly normal with lens for driving, performs annual eye exam  ?ADLs: capable ?Fall risk: none ?Home safety: good ?Cognitive evaluation: intact to orientation, naming, recall and repetition ?EOL planning: adv directives discussed, in place ? ?Flowsheet Row Office Visit from 08/20/2021 in [facility] at [place]  ?PHQ-2 Total Score 0  ? ?  ?  ?Flowsheet Row Office Visit from 08/20/2021 in [facility] at [place]  ?PHQ-9 Total Score 2  ? ?  ? ? ?  02/14/2015  ? 11:01 AM 04/24/2020  ?  4:07 PM 06/02/2020  ?  1:31 PM 08/20/2021  ?  8:24 AM  ?Fall Risk  ?Falls in the past year? No  0 0  ?Was there an injury with Fall?   0 0  ?Fall Risk Category Calculator   0 0  ?Fall Risk Category   Low Low  ?Patient Fall Risk Level  Low fall risk Low fall risk   ? ?I have personally reviewed and have noted ?1. The patient's medical and social history - reviewed today no changes ?2. Their use of alcohol, tobacco or illicit drugs ?3. Their current medications and supplements ?4. The patient's functional ability including ADL's, fall risks, home safety risks and hearing or visual impairment. ?5. Diet and physical activities ?6. Evidence for depression or mood disorders ?7. Care team reviewed and updated ?8.  The patient is not on an opioid pain medication ? ?Patient Care Team: ?Myrlene Broker, MD as PCP - General (Internal Medicine) ?Past Medical History:  ?Diagnosis Date  ? History of blood transfusion   ? As a child from father  ? ?Past Surgical History:  ?Procedure Laterality Date  ? COMBINED  HYSTEROSCOPY DIAGNOSTIC / D&C  08/1989  ? Benign  ? KNEE ARTHROSCOPY Right 01/2003  ? KNEE ARTHROSCOPY Left 08/2003  ? MANDIBLE SURGERY  06/1989  ? Reconstructive  ? MOHS SURGERY  12/13  ? on forehead  ? MOHS SURGERY Left 04/2016  ? face  ? SHOULDER ARTHROSCOPY Left 03/1996  ? ?Family History  ?Problem Relation Age of Onset  ? Heart disease Mother   ? Hypertension Mother   ? Thyroid disease Mother   ? Heart disease Father   ? Epilepsy Brother   ? Heart disease Maternal Grandfather 76  ? Heart disease Paternal Grandfather 45  ? Other Sister   ?     breast biopsy  ? Osteoporosis Sister   ? ?Review of Systems  ?Constitutional: Negative.   ?HENT: Negative.    ?Eyes: Negative.   ?Respiratory:  Negative for cough, chest tightness and shortness of breath.   ?Cardiovascular:  Negative for chest pain, palpitations and leg swelling.  ?Gastrointestinal:  Negative for abdominal distention, abdominal pain, constipation, diarrhea, nausea and vomiting.  ?Musculoskeletal: Negative.   ?Skin: Negative.   ?Neurological: Negative.   ?Psychiatric/Behavioral: Negative.    ? ?Objective:  ?Physical Exam ?Constitutional:   ?   Appearance: She is well-developed.  ?HENT:  ?   Head: Normocephalic and atraumatic.  ?Cardiovascular:  ?   Rate and Rhythm: Normal rate and regular rhythm.  ?Pulmonary:  ?  Effort: Pulmonary effort is normal. No respiratory distress.  ?   Breath sounds: Normal breath sounds. No wheezing or rales.  ?Abdominal:  ?   General: Bowel sounds are normal. There is no distension.  ?   Palpations: Abdomen is soft.  ?   Tenderness: There is no abdominal tenderness. There is no rebound.  ?Musculoskeletal:  ?   Cervical back: Normal range of motion.  ?Skin: ?   General: Skin is warm and dry.  ?Neurological:  ?   Mental Status: She is alert and oriented to person, place, and time.  ?   Coordination: Coordination normal.  ? ? ?Vitals:  ? 08/20/21 0811  ?BP: 122/70  ?Pulse: 75  ?Resp: 18  ?SpO2: 98%  ?Weight: 145 lb 6.4 oz (66 kg)   ?Height: 5' 4.5" (1.638 m)  ? ?This visit occurred during the SARS-CoV-2 public health emergency.  Safety protocols were in place, including screening questions prior to the visit, additional usage of staff PPE, and extensive cleaning of exam room while observing appropriate contact time as indicated for disinfecting solutions.  ? ?Assessment & Plan:  ? ?

## 2021-08-21 DIAGNOSIS — Z9189 Other specified personal risk factors, not elsewhere classified: Secondary | ICD-10-CM | POA: Insufficient documentation

## 2021-08-21 NOTE — Assessment & Plan Note (Signed)
We have decided to do a CT calcium score and base our statin recommendation on that. Checking lipid panel as well today. BP at goal and non-smoker. There is family history of heart disease.  ?

## 2021-08-21 NOTE — Assessment & Plan Note (Signed)
BP normal today and we will continue monitoring at every visit.  ?

## 2021-08-21 NOTE — Assessment & Plan Note (Signed)
Flu shot up to date. Covid-19 up to date. Pneumonia complete. Shingrix complete. Tetanus due Oct 2023 at pharmacy. Colonoscopy aged out future. Mammogram counseled about benefit/risk and she will do 1 more screening then consider stopping, pap smear aged out and dexa complete. Counseled about sun safety and mole surveillance. Counseled about the dangers of distracted driving. Given 10 year screening recommendations.  ? ?

## 2021-08-31 ENCOUNTER — Ambulatory Visit
Admission: RE | Admit: 2021-08-31 | Discharge: 2021-08-31 | Disposition: A | Discharge status: Home / Self Care | Payer: Self-pay | Source: Ambulatory Visit | Attending: Internal Medicine | Admitting: Internal Medicine

## 2021-08-31 ENCOUNTER — Encounter: Payer: Self-pay | Admitting: Internal Medicine

## 2021-08-31 DIAGNOSIS — Z9189 Other specified personal risk factors, not elsewhere classified: Secondary | ICD-10-CM

## 2022-01-25 DIAGNOSIS — Z85828 Personal history of other malignant neoplasm of skin: Secondary | ICD-10-CM | POA: Diagnosis not present

## 2022-01-25 DIAGNOSIS — L820 Inflamed seborrheic keratosis: Secondary | ICD-10-CM | POA: Diagnosis not present

## 2022-01-25 DIAGNOSIS — L570 Actinic keratosis: Secondary | ICD-10-CM | POA: Diagnosis not present

## 2022-02-17 ENCOUNTER — Encounter (HOSPITAL_BASED_OUTPATIENT_CLINIC_OR_DEPARTMENT_OTHER): Payer: Self-pay | Admitting: Obstetrics & Gynecology

## 2022-02-17 DIAGNOSIS — Z1231 Encounter for screening mammogram for malignant neoplasm of breast: Secondary | ICD-10-CM | POA: Diagnosis not present

## 2022-02-17 LAB — HM MAMMOGRAPHY

## 2022-06-07 DIAGNOSIS — H2513 Age-related nuclear cataract, bilateral: Secondary | ICD-10-CM | POA: Diagnosis not present

## 2022-06-15 DIAGNOSIS — L814 Other melanin hyperpigmentation: Secondary | ICD-10-CM | POA: Diagnosis not present

## 2022-06-15 DIAGNOSIS — L570 Actinic keratosis: Secondary | ICD-10-CM | POA: Diagnosis not present

## 2022-06-15 DIAGNOSIS — L821 Other seborrheic keratosis: Secondary | ICD-10-CM | POA: Diagnosis not present

## 2022-06-15 DIAGNOSIS — L820 Inflamed seborrheic keratosis: Secondary | ICD-10-CM | POA: Diagnosis not present

## 2022-06-15 DIAGNOSIS — L218 Other seborrheic dermatitis: Secondary | ICD-10-CM | POA: Diagnosis not present

## 2022-06-15 DIAGNOSIS — Z85828 Personal history of other malignant neoplasm of skin: Secondary | ICD-10-CM | POA: Diagnosis not present

## 2022-08-09 ENCOUNTER — Encounter: Payer: Self-pay | Admitting: *Deleted

## 2022-08-23 ENCOUNTER — Ambulatory Visit (INDEPENDENT_AMBULATORY_CARE_PROVIDER_SITE_OTHER): Payer: Medicare PPO | Admitting: Internal Medicine

## 2022-08-23 ENCOUNTER — Encounter: Payer: Self-pay | Admitting: Internal Medicine

## 2022-08-23 VITALS — BP 122/78 | HR 78 | Temp 98.3°F | Ht 64.0 in | Wt 148.8 lb

## 2022-08-23 DIAGNOSIS — Z0000 Encounter for general adult medical examination without abnormal findings: Secondary | ICD-10-CM | POA: Diagnosis not present

## 2022-08-23 DIAGNOSIS — R030 Elevated blood-pressure reading, without diagnosis of hypertension: Secondary | ICD-10-CM

## 2022-08-23 LAB — CBC
HCT: 41.6 % (ref 36.0–46.0)
Hemoglobin: 14 g/dL (ref 12.0–15.0)
MCHC: 33.7 g/dL (ref 30.0–36.0)
MCV: 89.7 fl (ref 78.0–100.0)
Platelets: 253 10*3/uL (ref 150.0–400.0)
RBC: 4.64 Mil/uL (ref 3.87–5.11)
RDW: 13.1 % (ref 11.5–15.5)
WBC: 7.9 10*3/uL (ref 4.0–10.5)

## 2022-08-23 LAB — COMPREHENSIVE METABOLIC PANEL
ALT: 12 U/L (ref 0–35)
AST: 24 U/L (ref 0–37)
Albumin: 4.2 g/dL (ref 3.5–5.2)
Alkaline Phosphatase: 67 U/L (ref 39–117)
BUN: 12 mg/dL (ref 6–23)
CO2: 28 mEq/L (ref 19–32)
Calcium: 9.4 mg/dL (ref 8.4–10.5)
Chloride: 102 mEq/L (ref 96–112)
Creatinine, Ser: 0.9 mg/dL (ref 0.40–1.20)
GFR: 60.92 mL/min (ref >60.00)
Glucose, Bld: 90 mg/dL (ref 70–99)
Potassium: 4.4 mEq/L (ref 3.5–5.1)
Sodium: 138 mEq/L (ref 135–145)
Total Bilirubin: 0.7 mg/dL (ref 0.2–1.2)
Total Protein: 7.2 g/dL (ref 6.0–8.3)

## 2022-08-23 LAB — LIPID PANEL
Cholesterol: 189 mg/dL (ref 0–200)
HDL: 58 mg/dL (ref >39.00)
LDL Cholesterol: 116 mg/dL — ABNORMAL HIGH (ref 0–99)
NonHDL: 131.41
Total CHOL/HDL Ratio: 3
Triglycerides: 77 mg/dL (ref 0.0–149.0)
VLDL: 15.4 mg/dL (ref 0.0–40.0)

## 2022-08-23 NOTE — Assessment & Plan Note (Signed)
BP normal today will continue to monitor at every visit.

## 2022-08-23 NOTE — Progress Notes (Signed)
Subjective:   Patient ID: Elizabeth Moses, female    DOB: 08-08-1945, 77 y.o.   MRN: [id number]  HPI Here for medicare wellness and physical, no new complaints. Please see A/P for status and treatment of chronic medical problems.   Diet: heart healthy Physical activity: active daily Depression/mood screen: negative Hearing: intact to whispered voice Visual acuity: grossly normal, performs annual eye exam  ADLs: capable Fall risk: none Home safety: good Cognitive evaluation: intact to orientation, naming, recall and repetition EOL planning: adv directives discussed, in place  [employer] Visit from 08/23/2022 in [facility] [place]  PHQ-2 Total Score 0       Flowsheet Row Office Visit from 08/20/2021 in [facility]  PHQ-9 Total Score 2         02/14/2015   11:01 AM 04/24/2020    4:07 PM 06/02/2020    1:31 PM 08/20/2021    8:24 AM 08/23/2022    9:36 AM  Fall Risk  Falls in the past year? No  0 0 0  Was there an injury with Fall?   0 0 0  Fall Risk Category Calculator   0 0 0  Fall Risk Category (Retired)   Low Low   (RETIRED) Patient Fall Risk Level  Low fall risk Low fall risk    Patient at Risk for Falls Due to     No Fall Risks  Fall risk Follow up     Falls evaluation completed    I have personally reviewed and have noted 1. The patient's medical and social history - reviewed today no changes 2. Their use of alcohol, tobacco or illicit drugs 3. Their current medications and supplements 4. The patient's functional ability including ADL's, fall risks, home safety risks and hearing or visual impairment. 5. Diet and physical activities 6. Evidence for depression or mood disorders 7. Care team reviewed and updated 8.  The patient is not on an opioid pain medication.  Patient Care Team: Myrlene Broker, MD as PCP - General (Internal Medicine) Past Medical History:  Diagnosis Date    History of blood transfusion    As a child from father   Past Surgical History:  Procedure Laterality Date   COMBINED HYSTEROSCOPY DIAGNOSTIC / D&C  08/1989   Benign   KNEE ARTHROSCOPY Right 01/2003   KNEE ARTHROSCOPY Left 08/2003   MANDIBLE SURGERY  06/1989   Reconstructive   MOHS SURGERY  12/13   on forehead   MOHS SURGERY Left 04/2016   face   SHOULDER ARTHROSCOPY Left 03/1996   Family History  Problem Relation Age of Onset   Heart disease Mother    Hypertension Mother    Thyroid disease Mother    Heart disease Father    Epilepsy Brother    Heart disease Maternal Grandfather 22   Heart disease Paternal Grandfather 62   Other Sister        breast biopsy   Osteoporosis Sister    Review of Systems  Constitutional: Negative.   HENT: Negative.    Eyes: Negative.   Respiratory:  Negative for cough, chest tightness and shortness of breath.   Cardiovascular:  Negative for chest pain, palpitations and leg swelling.  Gastrointestinal:  Negative for abdominal distention, abdominal pain, constipation, diarrhea, nausea and vomiting.  Musculoskeletal: Negative.   Skin: Negative.   Neurological: Negative.   Psychiatric/Behavioral: Negative.      Objective:  Physical Exam Constitutional:  Appearance: She is well-developed.  HENT:     Head: Normocephalic and atraumatic.  Cardiovascular:     Rate and Rhythm: Normal rate and regular rhythm.  Pulmonary:     Effort: Pulmonary effort is normal. No respiratory distress.     Breath sounds: Normal breath sounds. No wheezing or rales.  Abdominal:     General: Bowel sounds are normal. There is no distension.     Palpations: Abdomen is soft.     Tenderness: There is no abdominal tenderness. There is no rebound.  Musculoskeletal:     Cervical back: Normal range of motion.  Skin:    General: Skin is warm and dry.  Neurological:     Mental Status: She is alert and oriented to person, place, and time.     Coordination:  Coordination normal.     Vitals:   08/23/22 0926  BP: 122/78  Pulse: 78  Temp: 98.3 F (36.8 C)  TempSrc: Oral  SpO2: 98%  Weight: 148 lb 12.8 oz (67.5 kg)  Height: 5\' 4"  (1.626 m)    Assessment & Plan:

## 2022-08-23 NOTE — Assessment & Plan Note (Signed)
Flu shot yearly. Pneumonia complete. Shingrix complete. Tetanus due at pharmacy. Colonoscopy aged out. Mammogram aged out, pap smear aged out and dexa complete. Counseled about sun safety and mole surveillance. Counseled about the dangers of distracted driving. Given 10 year screening recommendations.

## 2022-09-13 ENCOUNTER — Ambulatory Visit (HOSPITAL_BASED_OUTPATIENT_CLINIC_OR_DEPARTMENT_OTHER): Payer: Medicare PPO | Admitting: Obstetrics & Gynecology

## 2022-12-15 DIAGNOSIS — Z85828 Personal history of other malignant neoplasm of skin: Secondary | ICD-10-CM | POA: Diagnosis not present

## 2022-12-15 DIAGNOSIS — D1801 Hemangioma of skin and subcutaneous tissue: Secondary | ICD-10-CM | POA: Diagnosis not present

## 2022-12-15 DIAGNOSIS — L820 Inflamed seborrheic keratosis: Secondary | ICD-10-CM | POA: Diagnosis not present

## 2022-12-15 DIAGNOSIS — L570 Actinic keratosis: Secondary | ICD-10-CM | POA: Diagnosis not present

## 2023-02-23 DIAGNOSIS — Z1231 Encounter for screening mammogram for malignant neoplasm of breast: Secondary | ICD-10-CM | POA: Diagnosis not present

## 2023-02-23 LAB — HM MAMMOGRAPHY

## 2023-06-06 DIAGNOSIS — H524 Presbyopia: Secondary | ICD-10-CM | POA: Diagnosis not present

## 2023-06-06 DIAGNOSIS — H2513 Age-related nuclear cataract, bilateral: Secondary | ICD-10-CM | POA: Diagnosis not present

## 2023-07-20 DIAGNOSIS — L821 Other seborrheic keratosis: Secondary | ICD-10-CM | POA: Diagnosis not present

## 2023-07-20 DIAGNOSIS — L814 Other melanin hyperpigmentation: Secondary | ICD-10-CM | POA: Diagnosis not present

## 2023-07-20 DIAGNOSIS — D225 Melanocytic nevi of trunk: Secondary | ICD-10-CM | POA: Diagnosis not present

## 2023-07-20 DIAGNOSIS — D224 Melanocytic nevi of scalp and neck: Secondary | ICD-10-CM | POA: Diagnosis not present

## 2023-07-20 DIAGNOSIS — L218 Other seborrheic dermatitis: Secondary | ICD-10-CM | POA: Diagnosis not present

## 2023-07-20 DIAGNOSIS — Z85828 Personal history of other malignant neoplasm of skin: Secondary | ICD-10-CM | POA: Diagnosis not present

## 2023-07-20 DIAGNOSIS — L570 Actinic keratosis: Secondary | ICD-10-CM | POA: Diagnosis not present

## 2023-08-08 ENCOUNTER — Ambulatory Visit (INDEPENDENT_AMBULATORY_CARE_PROVIDER_SITE_OTHER)

## 2023-08-08 VITALS — Ht 64.0 in | Wt 148.0 lb

## 2023-08-08 DIAGNOSIS — Z0000 Encounter for general adult medical examination without abnormal findings: Secondary | ICD-10-CM

## 2023-08-08 NOTE — Progress Notes (Signed)
 Subjective:   Elizabeth Moses is a 78 y.o. who presents for a Medicare Wellness preventive visit.  Visit Complete: Virtual I connected with  [name] on 08/08/23 by a video and audio enabled telemedicine application and verified that I am speaking with the correct person using two identifiers.  Patient Location: Home  Provider Location: Home Office  I discussed the limitations of evaluation and management by telemedicine. The patient expressed understanding and agreed to proceed.  Vital Signs: Because this visit was a virtual/telehealth visit, some criteria may be missing or patient reported. Any vitals not documented were not able to be obtained and vitals that have been documented are patient reported.  VideoError- Librarian, academic were attempted between this provider and patient, however failed, due to patient having technical difficulties OR patient did not have access to video capability.  We continued and completed visit with audio only.   Persons Participating in Visit: Patient.  AWV Questionnaire: No: Patient Medicare AWV questionnaire was not completed prior to this visit.  Cardiac Risk Factors include: advanced age (>82men, >42 women)     Objective:    Today's Vitals   08/08/23 0907  Weight: 148 lb (67.1 kg)  Height: 5\' 4"  (1.626 m)   Body mass index is 25.4 kg/m.     08/08/2023    9:49 AM 01/28/2020    3:43 PM  Advanced Directives  Does Patient Have a Medical Advance Directive? No No  Would patient like information on creating a medical advance directive?  No - Patient declined    Current Medications (verified) Outpatient Encounter Medications as of 08/08/2023  Medication Sig   docusate sodium (COLACE) 100 MG capsule Take 100 mg by mouth 2 (two) times daily.   Multiple Vitamin (MULTIVITAMIN) tablet Take 1 tablet by mouth daily. Vitafusion womens vitamins   multivitamin-lutein (OCUVITE-LUTEIN) CAPS capsule Take 1  capsule by mouth daily.   No facility-administered encounter medications on file as of 08/08/2023.    Allergies (verified) Lodine [etodolac] and Penicillins   History: Past Medical History:  Diagnosis Date   History of blood transfusion    As a child from father   Past Surgical History:  Procedure Laterality Date   COMBINED HYSTEROSCOPY DIAGNOSTIC / D&C  08/1989   Benign   KNEE ARTHROSCOPY Right 01/2003   KNEE ARTHROSCOPY Left 08/2003   MANDIBLE SURGERY  06/1989   Reconstructive   MOHS SURGERY  12/13   on forehead   MOHS SURGERY Left 04/2016   face   SHOULDER ARTHROSCOPY Left 03/1996   Family History  Problem Relation Age of Onset   Heart disease Mother    Hypertension Mother    Thyroid disease Mother    Heart disease Father    Epilepsy Brother    Heart disease Maternal Grandfather 30   Heart disease Paternal Grandfather 55   Other Sister        breast biopsy   Osteoporosis Sister    Social History   Socioeconomic History   Marital status: Married    Spouse name: Nadine Counts   Number of children: 2   Years of education: Not on file   Highest education level: Not on file  Occupational History   Occupation: RETIRED  Tobacco Use   Smoking status: Never   Smokeless tobacco: Never  Vaping Use   Vaping status: Never Used  Substance and Sexual Activity   Alcohol use: No   Drug use: No   Sexual activity: Not Currently  Partners: Male    Birth control/protection: Post-menopausal  Other Topics Concern   Not on file  Social History Narrative   Lives with husband   Social Drivers of Health   Financial Resource Strain: Low Risk  (08/08/2023)   Overall Financial Resource Strain (CARDIA)    Difficulty of Paying Living Expenses: Not hard at all  Food Insecurity: No Food Insecurity (08/08/2023)   Hunger Vital Sign    Worried About Running Out of Food in the Last Year: Never true    Ran Out of Food in the Last Year: Never true  Transportation Needs: No Transportation  Needs (08/08/2023)   PRAPARE - Administrator, Civil Service (Medical): No    Lack of Transportation (Non-Medical): No  Physical Activity: Sufficiently Active (08/08/2023)   Exercise Vital Sign    Days of Exercise per Week: 6 days    Minutes of Exercise per Session: 40 min  Stress: No Stress Concern Present (08/08/2023)   [employer] of Occupational Health - Occupational Stress Questionnaire    Feeling of Stress : Not at all  Social Connections: Socially Integrated (08/08/2023)   Social Connection and Isolation Panel [NHANES]    Frequency of Communication with Friends and Family: More than three times a week    Frequency of Social Gatherings with Friends and Family: More than three times a week    Attends Religious Services: More than 4 times per year    Active Member of [employer] or Organizations: Yes    Attends Banker Meetings: 1 to 4 times per year    Marital Status: Married    Tobacco Counseling Counseling given: Not Answered    Clinical Intake:  Pre-visit preparation completed: Yes  Pain : No/denies pain     BMI - recorded: 25.4 Nutritional Status: BMI 25 -29 Overweight Nutritional Risks: None Diabetes: No  No results found for: "HGBA1C"   How often do you need to have someone help you when you read instructions, pamphlets, or other written materials from your doctor or pharmacy?: 1 - Never  Interpreter Needed?: No  Information entered by :: Shealynn Saulnier, RMA   Activities of Daily Living     08/08/2023    9:07 AM  In your present state of health, do you have any difficulty performing the following activities:  Hearing? 0  Vision? 0  Difficulty concentrating or making decisions? 0  Walking or climbing stairs? 0  Dressing or bathing? 0  Doing errands, shopping? 0  Preparing Food and eating ? N  Using the Toilet? N  In the past six months, have you accidently leaked urine? N  Do you have problems with loss of bowel control? N   Managing your Medications? N  Managing your Finances? N  Housekeeping or managing your Housekeeping? N    Patient Care Team: Myrlene Broker, MD as PCP - General (Internal Medicine)  Indicate any recent Medical Services you may have received from other than [facility] providers in the past year (date may be approximate).     Assessment:   This is a routine wellness examination for Parys.  Hearing/Vision screen Hearing Screening - Comments:: Denies hearing difficulties   Vision Screening - Comments:: Wears eyeglasses when driving at night    Goals Addressed               This Visit's Progress     Patient Stated (pt-stated)        To remain healthy.  Depression Screen     08/08/2023    9:53 AM 08/23/2022    9:36 AM 08/20/2021    8:23 AM 06/02/2020    1:31 PM 02/14/2015   11:01 AM  PHQ 2/9 Scores  PHQ - 2 Score 0 0 0 0 0  PHQ- 9 Score 0  2      Fall Risk     08/08/2023    9:49 AM 08/23/2022    9:36 AM 08/20/2021    8:24 AM 06/02/2020    1:31 PM 02/14/2015   11:01 AM  Fall Risk   Falls in the past year? 0 0 0 0 No  Number falls in past yr: 0 0 0 0   Injury with Fall? 0 0 0 0   Risk for fall due to : No Fall Risks No Fall Risks     Follow up Falls prevention discussed;Falls evaluation completed Falls evaluation completed       MEDICARE RISK AT HOME:  Medicare Risk at Home Any stairs in or around the home?: Yes If so, are there any without handrails?: Yes Home free of loose throw rugs in walkways, pet beds, electrical cords, etc?: Yes Adequate lighting in your home to reduce risk of falls?: Yes Life alert?: No Use of a cane, walker or w/c?: No Grab bars in the bathroom?: No Shower chair or bench in shower?: No Elevated toilet seat or a handicapped toilet?: No  TIMED UP AND GO:  Was the test performed?  No  Cognitive Function: 6CIT completed        08/08/2023    9:50 AM  6CIT Screen  What Year? 0 points  What month? 0 points  What time?  0 points  Count back from 20 0 points  Months in reverse 0 points  Repeat phrase 0 points  Total Score 0 points    Immunizations Immunization History  Administered Date(s) Administered   Influenza Split 01/25/2015   Influenza, High Dose Seasonal PF 01/28/2017, 02/14/2018, 01/27/2019   Influenza,inj,Quad PF,6+ Mos 02/07/2014   Influenza-Unspecified 01/28/2017, 02/14/2018, 01/27/2019   PFIZER(Purple Top)SARS-COV-2 Vaccination 06/01/2019, 06/26/2019, 03/09/2020   Pneumococcal Conjugate-13 07/22/2015   Pneumococcal Polysaccharide-23 07/18/2014   Tdap 02/17/2012   Zoster Recombinant(Shingrix) 03/26/2017, 07/04/2017   Zoster, Live 04/27/2011    Screening Tests Health Maintenance  Topic Date Due   DTaP/Tdap/Td (2 - Td or Tdap) 02/16/2022   COVID-19 Vaccine (4 - 2024-25 season) 12/26/2022   Medicare Annual Wellness (AWV)  08/23/2023   INFLUENZA VACCINE  11/25/2023   Pneumonia Vaccine 2+ Years old  Completed   DEXA SCAN  Completed   Zoster Vaccines- Shingrix  Completed   HPV VACCINES  Aged Out   Meningococcal B Vaccine  Aged Out   Colonoscopy  Discontinued   Hepatitis C Screening  Discontinued    Health Maintenance  Health Maintenance Due  Topic Date Due   DTaP/Tdap/Td (2 - Td or Tdap) 02/16/2022   COVID-19 Vaccine (4 - 2024-25 season) 12/26/2022   Medicare Annual Wellness (AWV)  08/23/2023   Health Maintenance Items Addressed: See Nurse Notes  Additional Screening:  Vision Screening: Recommended annual ophthalmology exams for early detection of glaucoma and other disorders of the eye.  Dental Screening: Recommended annual dental exams for proper oral hygiene  Community Resource Referral / Chronic Care Management: CRR required this visit?  No   CCM required this visit?  No     Plan:     I have personally reviewed and noted the following in  the patient's chart:   Medical and social history Use of alcohol, tobacco or illicit drugs  Current medications and  supplements including opioid prescriptions. Patient is not currently taking opioid prescriptions. Functional ability and status Nutritional status Physical activity Advanced directives List of other physicians Hospitalizations, surgeries, and ER visits in previous 12 months Vitals Screenings to include cognitive, depression, and falls Referrals and appointments  In addition, I have reviewed and discussed with patient certain preventive protocols, quality metrics, and best practice recommendations. A written personalized care plan for preventive services as well as general preventive health recommendations were provided to patient.     Zamar Odwyer L Betzalel Umbarger, CMA   08/08/2023   After Visit Summary: (MyChart) Due to this being a telephonic visit, the after visit summary with patients personalized plan was offered to patient via MyChart   Notes: Please refer to Routing Comments.

## 2023-08-08 NOTE — Patient Instructions (Signed)
 Elizabeth Moses , Thank you for taking time to come for your Medicare Wellness Visit. I appreciate your ongoing commitment to your health goals. Please review the following plan we discussed and let me know if I can assist you in the future.   Referrals/Orders/Follow-Ups/Clinician Recommendations: You are due for a Tetanus vaccine.  Keep up the good work.  This is a list of the screening recommended for you and due dates:  Health Maintenance  Topic Date Due   DTaP/Tdap/Td vaccine (2 - Td or Tdap) 02/16/2022   COVID-19 Vaccine (4 - 2024-25 season) 12/26/2022   Medicare Annual Wellness Visit  08/23/2023   Flu Shot  11/25/2023   Pneumonia Vaccine  Completed   DEXA scan (bone density measurement)  Completed   Zoster (Shingles) Vaccine  Completed   HPV Vaccine  Aged Out   Meningitis B Vaccine  Aged Out   Colon Cancer Screening  Discontinued   Hepatitis C Screening  Discontinued    Advanced directives: (Declined) Advance directive discussed with you today. Even though you declined this today, please call our office should you change your mind, and we can give you the proper paperwork for you to fill out.  Next Medicare Annual Wellness Visit scheduled for next year: Yes

## 2023-08-25 ENCOUNTER — Encounter: Admitting: Internal Medicine

## 2023-08-26 ENCOUNTER — Encounter: Payer: Self-pay | Admitting: Internal Medicine

## 2023-08-26 ENCOUNTER — Ambulatory Visit (INDEPENDENT_AMBULATORY_CARE_PROVIDER_SITE_OTHER): Admitting: Internal Medicine

## 2023-08-26 VITALS — BP 138/80 | HR 77 | Temp 97.7°F | Ht 64.0 in | Wt 149.0 lb

## 2023-08-26 DIAGNOSIS — Z0000 Encounter for general adult medical examination without abnormal findings: Secondary | ICD-10-CM | POA: Diagnosis not present

## 2023-08-26 DIAGNOSIS — N281 Cyst of kidney, acquired: Secondary | ICD-10-CM | POA: Insufficient documentation

## 2023-08-26 LAB — CBC
HCT: 42.7 % (ref 36.0–46.0)
Hemoglobin: 14.1 g/dL (ref 12.0–15.0)
MCHC: 33.1 g/dL (ref 30.0–36.0)
MCV: 91.6 fl (ref 78.0–100.0)
Platelets: 252 10*3/uL (ref 150.0–400.0)
RBC: 4.66 Mil/uL (ref 3.87–5.11)
RDW: 13.1 % (ref 11.5–15.5)
WBC: 5.9 10*3/uL (ref 4.0–10.5)

## 2023-08-26 LAB — COMPREHENSIVE METABOLIC PANEL WITH GFR
ALT: 14 U/L (ref 0–35)
AST: 26 U/L (ref 0–37)
Albumin: 4.5 g/dL (ref 3.5–5.2)
Alkaline Phosphatase: 71 U/L (ref 39–117)
BUN: 15 mg/dL (ref 6–23)
CO2: 30 meq/L (ref 19–32)
Calcium: 9.6 mg/dL (ref 8.4–10.5)
Chloride: 104 meq/L (ref 96–112)
Creatinine, Ser: 0.9 mg/dL (ref 0.40–1.20)
GFR: 60.5 mL/min (ref >60.00)
Glucose, Bld: 95 mg/dL (ref 70–99)
Potassium: 4.5 meq/L (ref 3.5–5.1)
Sodium: 140 meq/L (ref 135–145)
Total Bilirubin: 0.7 mg/dL (ref 0.2–1.2)
Total Protein: 7.8 g/dL (ref 6.0–8.3)

## 2023-08-26 LAB — LIPID PANEL
Cholesterol: 231 mg/dL — ABNORMAL HIGH (ref 0–200)
HDL: 63.5 mg/dL (ref >39.00)
LDL Cholesterol: 156 mg/dL — ABNORMAL HIGH (ref 0–99)
NonHDL: 167.96
Total CHOL/HDL Ratio: 4
Triglycerides: 60 mg/dL (ref 0.0–149.0)
VLDL: 12 mg/dL (ref 0.0–40.0)

## 2023-08-26 LAB — HM DIABETES EYE EXAM

## 2023-08-26 NOTE — Assessment & Plan Note (Signed)
 Noted on a calcium score she shows me. There are not enough mention of features to confidently say benign. Checking US  renal to assess.

## 2023-08-26 NOTE — Assessment & Plan Note (Signed)
Flu shot yearly. Pneumonia complete. Shingrix complete. Tetanus due at pharmacy. Colonoscopy aged out. Mammogram aged out, pap smear aged out and dexa complete. Counseled about sun safety and mole surveillance. Counseled about the dangers of distracted driving. Given 10 year screening recommendations.

## 2023-08-26 NOTE — Progress Notes (Signed)
   Subjective:   Patient ID: Elizabeth Moses, female    DOB: [birth year], 78 y.o.   MRN: [id number]  HPI The patient is here for physical.  PMH, [facility], social history reviewed and updated  Review of Systems  Constitutional: Negative.   HENT: Negative.    Eyes: Negative.   Respiratory:  Negative for cough, chest tightness and shortness of breath.   Cardiovascular:  Negative for chest pain, palpitations and leg swelling.  Gastrointestinal:  Negative for abdominal distention, abdominal pain, constipation, diarrhea, nausea and vomiting.  Musculoskeletal: Negative.   Skin: Negative.   Neurological: Negative.   Psychiatric/Behavioral: Negative.      Objective:  Physical Exam Constitutional:      Appearance: She is well-developed.  HENT:     Head: Normocephalic and atraumatic.  Cardiovascular:     Rate and Rhythm: Normal rate and regular rhythm.  Pulmonary:     Effort: Pulmonary effort is normal. No respiratory distress.     Breath sounds: Normal breath sounds. No wheezing or rales.  Abdominal:     General: Bowel sounds are normal. There is no distension.     Palpations: Abdomen is soft.     Tenderness: There is no abdominal tenderness. There is no rebound.  Musculoskeletal:     Cervical back: Normal range of motion.  Skin:    General: Skin is warm and dry.  Neurological:     Mental Status: She is alert and oriented to person, place, and time.     Coordination: Coordination normal.     Vitals:   08/26/23 0856  BP: 138/80  Pulse: 77  Temp: 97.7 F (36.5 C)  TempSrc: Oral  SpO2: 96%  Weight: 149 lb (67.6 kg)  Height: 5\' 4"  (1.626 m)    Assessment & Plan:

## 2023-08-29 ENCOUNTER — Encounter: Payer: Self-pay | Admitting: Internal Medicine

## 2023-08-31 ENCOUNTER — Ambulatory Visit
Admission: RE | Admit: 2023-08-31 | Discharge: 2023-08-31 | Disposition: A | Discharge status: Home / Self Care | Source: Ambulatory Visit | Attending: Internal Medicine | Admitting: Internal Medicine

## 2023-08-31 DIAGNOSIS — N281 Cyst of kidney, acquired: Secondary | ICD-10-CM

## 2023-09-02 ENCOUNTER — Encounter: Payer: Self-pay | Admitting: Internal Medicine

## 2023-09-07 ENCOUNTER — Ambulatory Visit: Payer: Self-pay

## 2024-02-09 ENCOUNTER — Ambulatory Visit: Payer: Self-pay

## 2024-02-09 NOTE — Telephone Encounter (Signed)
 FYI Only or Action Required?: FYI only for provider.  Patient was last seen in primary care on 08/26/2023 by Rollene Almarie LABOR, MD.  Called Nurse Triage reporting Leg Injury.  Symptoms began several weeks ago.  Interventions attempted: Rest, hydration, or home remedies and Ice/heat application.  Symptoms are: gradually worsening.  Triage Disposition: See PCP When Office is Open (Within 3 Days)  Patient/caregiver understands and will follow disposition?: Yes Reason for Disposition  [1] After 2 weeks AND [2] still painful or swollen  Answer Assessment - Initial Assessment Questions Patient is very active, within the last week has been experiencing right calf pain when walking. Does not experience any knee pain unless it's pressed on.   1. MECHANISM: How did the injury happen? (e.g., twisting injury, direct blow)      Tripped over a ladder, fell forward and hit knee on the ground (grass)  2. ONSET: When did the injury happen? (e.g., minutes, hours ago)      August 29  3. LOCATION: Where is the injury located?      Right knee  4. APPEARANCE of INJURY: What does the injury look like?  (e.g., deformity of leg)     Denies bruising, discoloration or swelling  5. SEVERITY: Can you put weight on that leg? Can you walk?      Yes, but can hardly go 1000 steps, calf muscle is sore   7. PAIN: Is there pain? If Yes, ask: How bad is the pain?   What does it keep you from doing? (Scale 0-10; or none, mild, moderate, severe)     Painful to touch, very sharp pain. Experiences calf pain when going out and walking, this started about a week ago.  9. OTHER SYMPTOMS: Do you have any other symptoms?      Denies  Protocols used: Leg Injury-A-AH  Copied from CRM #[id number]. Topic: Clinical - Red Word Triage >> Feb 09, 2024  3:01 PM Berneda FALCON wrote: Red Word that prompted transfer to Nurse Triage: Pt thinks she cracked her patella in her right knee-fell over a ladder about 7  weeks ago States she is having pain in the knee-feels like a needle sticking in it. Cannot move more than 1000 steps without pain.  She did not go to the ED when this happened. When she tripped over the ladder she fell on the right knee and it has not gotten better over the last 7 weeks.  Pain is in the right knee and calf. States it is sore, and there feels like there is a needle sticking in it.

## 2024-02-10 NOTE — Telephone Encounter (Signed)
 Called and left message for patient to return call to clinic if Elizabeth Moses still had the 3:40 available.

## 2024-02-13 ENCOUNTER — Encounter: Payer: Self-pay | Admitting: Emergency Medicine

## 2024-02-13 ENCOUNTER — Ambulatory Visit: Admitting: Emergency Medicine

## 2024-02-13 ENCOUNTER — Ambulatory Visit: Payer: Self-pay | Admitting: Emergency Medicine

## 2024-02-13 ENCOUNTER — Ambulatory Visit (INDEPENDENT_AMBULATORY_CARE_PROVIDER_SITE_OTHER)

## 2024-02-13 VITALS — BP 142/86 | HR 78 | Temp 98.1°F | Ht 66.0 in | Wt 152.6 lb

## 2024-02-13 DIAGNOSIS — M85861 Other specified disorders of bone density and structure, right lower leg: Secondary | ICD-10-CM | POA: Diagnosis not present

## 2024-02-13 DIAGNOSIS — M25561 Pain in right knee: Secondary | ICD-10-CM

## 2024-02-13 DIAGNOSIS — M1711 Unilateral primary osteoarthritis, right knee: Secondary | ICD-10-CM | POA: Diagnosis not present

## 2024-02-13 NOTE — Patient Instructions (Signed)
 Acute Knee Pain, Adult Many things can cause knee pain. Sometimes, knee pain is sudden (acute). It may be caused by damage, swelling, or irritation of the muscles and tissues that support your knee. Pain may come from: A fall. An injury to the knee from twisting motions. A hit to the knee. Infection. The pain often goes away on its own with time and rest. If the pain does not go away, tests may be done to find out what is causing the pain. These may include: Imaging tests, such as an X-ray, MRI, CT scan, or ultrasound. Joint aspiration. In this test, fluid is removed from the knee and checked. Arthroscopy. In this test, a lighted tube is put in the knee and an image is shown on a screen. A biopsy. In this test, a health care provider will remove a small piece of tissue for testing. Follow these instructions at home: If you have a knee sleeve or brace that can be taken off:  Wear the knee sleeve or brace as told by your provider. Take it off only if your provider says that you can. Check the skin around it every day. Tell your provider if you see problems. Loosen the knee sleeve or brace if your toes tingle, are numb, or turn cold and blue. Keep the knee sleeve or brace clean and dry. Bathing If the knee sleeve or brace is not waterproof: Do not let it get wet. Cover it when you take a bath or shower. Use a cover that does not let any water in. Managing pain, stiffness, and swelling  If told, put ice on the area. If you have a knee sleeve or brace that you can take off, remove it as told. Put ice in a plastic bag. Place a towel between your skin and the bag. Leave the ice on for 20 minutes, 2-3 times a day. If your skin turns bright red, take off the ice right away to prevent skin damage. The risk of damage is higher if you cannot feel pain, heat, or cold. Move your toes often to reduce stiffness and swelling. Raise the injured area above the level of your heart while you are sitting  or lying down. Use a pillow to support your foot as needed. If told, use an elastic bandage to put pressure (compression) on your injured knee. This may control swelling, give support, and help with discomfort. Sleep with a pillow under your knee. Activity Rest your knee. Do not do things that cause pain or make pain worse. Do not stand or walk on your injured knee until you're told it's okay. Use crutches as told. Avoid activities where both feet leave the ground at the same time and put stress on the joints. Avoid running, jumping rope, and doing jumping jacks. Work with a physical therapist to make a safe exercise program if told. Physical therapy helps your knee move better and get stronger. Exercise as told. General instructions Take your medicines only as told by your provider. If you are overweight, work with your provider and an expert in healthy eating, called a dietician, to set goals to lose weight. Being overweight can make your knee hurt more. Do not smoke, vape, or use products with nicotine or tobacco in them. If you need help quitting, talk with your provider. Return to normal activities when you are told. Ask what things are safe for you to do. Watch for any changes in your symptoms. Keep all follow-up visits. Your provider will check  your healing and adjust treatments if needed. Contact a health care provider if: The knee pain does not stop. The knee pain changes or gets worse. You have a fever along with knee pain. Your knee is red or feels warm when you touch it. Your knee gives out or locks up. Get help right away if: Your knee swells and the swelling gets worse. You cannot move your knee. You have very bad knee pain that does not get better with medicine. This information is not intended to replace advice given to you by your health care provider. Make sure you discuss any questions you have with your health care provider. Document Revised: 01/13/2023 Document  Reviewed: 06/07/2022 Elsevier Patient Education  2024 [employer].

## 2024-02-13 NOTE — Progress Notes (Signed)
 Elizabeth Moses 78 y.o.   Chief Complaint  Patient presents with   Knee Pain    Right knee. Started August 29th. First time being seen for this. Not trying anything OTC or Ice or anything to help. No fevers.     HISTORY OF PRESENT ILLNESS: Acute problem visit today. This is a 78 y.o. female complaining of right knee pain that started August 29 after hitting it against a ladder Has been able to continue playing tennis and daily walks. No other associated symptoms No other complaints or medical concerns today.  Knee Pain      Prior to Admission medications   Medication Sig Start Date End Date Taking? Authorizing Provider  Multiple Vitamin (MULTIVITAMIN) tablet Take 1 tablet by mouth daily. Vitafusion womens vitamins   Yes [provider]  multivitamin-lutein (OCUVITE-LUTEIN) CAPS capsule Take 1 capsule by mouth daily.   Yes [provider]  docusate sodium (COLACE) 100 MG capsule Take 100 mg by mouth 2 (two) times daily. Patient not taking: Reported on 02/13/2024    [provider]    Allergies  Allergen Reactions   Lodine [Etodolac] Rash   Penicillins Rash    Patient Active Problem List   Diagnosis Date Noted   Renal cyst, left 08/26/2023   Polyarthralgia 02/07/2018   Low back pain 02/07/2018   Eustachian tube dysfunction, right 05/06/2017   Routine general medical examination at a health care facility 07/18/2014    Past Medical History:  Diagnosis Date   History of blood transfusion    As a child from father    Past Surgical History:  Procedure Laterality Date   COMBINED HYSTEROSCOPY DIAGNOSTIC / D&C  08/1989   Benign   KNEE ARTHROSCOPY Right 01/2003   KNEE ARTHROSCOPY Left 08/2003   MANDIBLE SURGERY  06/1989   Reconstructive   MOHS SURGERY  12/13   on forehead   MOHS SURGERY Left 04/2016   face   SHOULDER ARTHROSCOPY Left 03/1996    Social History   Socioeconomic History   Marital status: Married    Spouse name:  Octaviano   Number of children: 2   Years of education: Not on file   Highest education level: Not on file  Occupational History   Occupation: RETIRED  Tobacco Use   Smoking status: Never   Smokeless tobacco: Never  Vaping Use   Vaping status: Never Used  Substance and Sexual Activity   Alcohol use: No   Drug use: No   Sexual activity: Not Currently    Partners: Male    Birth control/protection: Post-menopausal  Other Topics Concern   Not on file  Social History Narrative   Lives with husband   Social Drivers of Health   Financial Resource Strain: Low Risk  (08/08/2023)   Overall Financial Resource Strain (CARDIA)    Difficulty of Paying Living Expenses: Not hard at all  Food Insecurity: No Food Insecurity (08/08/2023)   Hunger Vital Sign    Worried About Running Out of Food in the Last Year: Never true    Ran Out of Food in the Last Year: Never true  Transportation Needs: No Transportation Needs (08/08/2023)   PRAPARE - Administrator, Civil Service (Medical): No    Lack of Transportation (Non-Medical): No  Physical Activity: Sufficiently Active (08/08/2023)   Exercise Vital Sign    Days of Exercise per Week: 6 days    Minutes of Exercise per Session: 40 min  Stress: No Stress Concern Present (  08/08/2023)   [employer] of Occupational Health - Occupational Stress Questionnaire    Feeling of Stress : Not at all  Social Connections: Socially Integrated (08/08/2023)   Social Connection and Isolation Panel    Frequency of Communication with Friends and Family: More than three times a week    Frequency of Social Gatherings with Friends and Family: More than three times a week    Attends Religious Services: More than 4 times per year    Active Member of [employer] or Organizations: Yes    Attends Banker Meetings: 1 to 4 times per year    Marital Status: Married  Catering manager Violence: Not At Risk (08/08/2023)   Humiliation, Afraid, Rape, and Kick  questionnaire    Fear of Current or Ex-Partner: No    Emotionally Abused: No    Physically Abused: No    Sexually Abused: No    Family History  Problem Relation Age of Onset   Heart disease Mother    Hypertension Mother    Thyroid  disease Mother    Heart disease Father    Epilepsy Brother    Heart disease Maternal Grandfather 66   Heart disease Paternal Grandfather 31   Other Sister        breast biopsy   Osteoporosis Sister      Review of Systems  Constitutional: Negative.  Negative for chills and fever.  HENT: Negative.  Negative for congestion and sore throat.   Respiratory: Negative.  Negative for cough and shortness of breath.   Cardiovascular: Negative.  Negative for chest pain and palpitations.  Gastrointestinal:  Negative for abdominal pain, diarrhea, nausea and vomiting.  Genitourinary: Negative.  Negative for dysuria and hematuria.  Musculoskeletal:  Positive for joint pain.  Skin: Negative.  Negative for rash.  Neurological: Negative.  Negative for dizziness and headaches.  All other systems reviewed and are negative.   Vitals:   02/13/24 0843  BP: (!) 142/86  Pulse: 78  Temp: 98.1 F (36.7 C)  SpO2: 97%    Physical Exam Vitals reviewed.  Constitutional:      Appearance: Normal appearance.  HENT:     Head: Normocephalic.  Eyes:     Extraocular Movements: Extraocular movements intact.  Cardiovascular:     Rate and Rhythm: Normal rate.  Pulmonary:     Effort: Pulmonary effort is normal.  Musculoskeletal:     Comments: Right knee: No swelling or erythema.  Mild deep tenderness to patellar area.  Stable in flexion and extension.  Full range of motion.  Stable knees.  Skin:    General: Skin is warm and dry.  Neurological:     Mental Status: She is alert and oriented to person, place, and time.  Psychiatric:        Mood and Affect: Mood normal.        Behavior: Behavior normal.    DG Knee Complete 4 Views Right Result Date: 02/13/2024 CLINICAL  DATA:  RIGHT knee pain status post injury on 12/23/2023. EXAM: RIGHT KNEE - COMPLETE 4+ VIEW COMPARISON:  None available FINDINGS: Mild diffuse osteopenia. Mild degenerative changes of the medial and patellofemoral compartments. No acute osseous abnormalities. Soft tissues within normal limits. IMPRESSION: 1. No acute abnormality of the RIGHT knee. 2. Mild degenerative changes of the RIGHT knee. Electronically Signed   By: Aliene Lloyd M.D.   On: 02/13/2024 09:16     ASSESSMENT & PLAN: I personally spent a total of 30 minutes minutes in the care  of the patient today including preparing to see the patient, getting/reviewing separately obtained history, performing a medically appropriate exam/evaluation, counseling and educating, placing orders, documenting clinical information in the EHR, and coordinating care.  Problem List Items Addressed This Visit       Other   Acute pain of right knee - Primary   Clinically stable.  No red flag signs or symptoms. Stable knee examination.  No major concerns. Recommend x-ray today.  Will review images when available. May need orthopedic evaluation      Relevant Orders   DG Knee Complete 4 Views Right   Patient Instructions  Acute Knee Pain, Adult Many things can cause knee pain. Sometimes, knee pain is sudden (acute). It may be caused by damage, swelling, or irritation of the muscles and tissues that support your knee. Pain may come from: A fall. An injury to the knee from twisting motions. A hit to the knee. Infection. The pain often goes away on its own with time and rest. If the pain does not go away, tests may be done to find out what is causing the pain. These may include: Imaging tests, such as an X-ray, MRI, CT scan, or ultrasound. Joint aspiration. In this test, fluid is removed from the knee and checked. Arthroscopy. In this test, a lighted tube is put in the knee and an image is shown on a screen. A biopsy. In this test, a health care  provider will remove a small piece of tissue for testing. Follow these instructions at home: If you have a knee sleeve or brace that can be taken off:  Wear the knee sleeve or brace as told by your provider. Take it off only if your provider says that you can. Check the skin around it every day. Tell your provider if you see problems. Loosen the knee sleeve or brace if your toes tingle, are numb, or turn cold and blue. Keep the knee sleeve or brace clean and dry. Bathing If the knee sleeve or brace is not waterproof: Do not let it get wet. Cover it when you take a bath or shower. Use a cover that does not let any water in. Managing pain, stiffness, and swelling  If told, put ice on the area. If you have a knee sleeve or brace that you can take off, remove it as told. Put ice in a plastic bag. Place a towel between your skin and the bag. Leave the ice on for 20 minutes, 2-3 times a day. If your skin turns bright red, take off the ice right away to prevent skin damage. The risk of damage is higher if you cannot feel pain, heat, or cold. Move your toes often to reduce stiffness and swelling. Raise the injured area above the level of your heart while you are sitting or lying down. Use a pillow to support your foot as needed. If told, use an elastic bandage to put pressure (compression) on your injured knee. This may control swelling, give support, and help with discomfort. Sleep with a pillow under your knee. Activity Rest your knee. Do not do things that cause pain or make pain worse. Do not stand or walk on your injured knee until you're told it's okay. Use crutches as told. Avoid activities where both feet leave the ground at the same time and put stress on the joints. Avoid running, jumping rope, and doing jumping jacks. Work with a physical therapist to make a safe exercise program if told. Physical therapy  helps your knee move better and get stronger. Exercise as told. General  instructions Take your medicines only as told by your provider. If you are overweight, work with your provider and an expert in healthy eating, called a dietician, to set goals to lose weight. Being overweight can make your knee hurt more. Do not smoke, vape, or use products with nicotine or tobacco in them. If you need help quitting, talk with your provider. Return to normal activities when you are told. Ask what things are safe for you to do. Watch for any changes in your symptoms. Keep all follow-up visits. Your provider will check your healing and adjust treatments if needed. Contact a health care provider if: The knee pain does not stop. The knee pain changes or gets worse. You have a fever along with knee pain. Your knee is red or feels warm when you touch it. Your knee gives out or locks up. Get help right away if: Your knee swells and the swelling gets worse. You cannot move your knee. You have very bad knee pain that does not get better with medicine. This information is not intended to replace advice given to you by your health care provider. Make sure you discuss any questions you have with your health care provider. Document Revised: 01/13/2023 Document Reviewed: 06/07/2022 Elsevier Patient Education  2024 [employer] Inc.   Emil Schaumann, MD [facility]

## 2024-02-13 NOTE — Assessment & Plan Note (Signed)
 Clinically stable.  No red flag signs or symptoms. Stable knee examination.  No major concerns. Recommend x-ray today.  Will review images when available. May need orthopedic evaluation

## 2024-02-27 DIAGNOSIS — Z1231 Encounter for screening mammogram for malignant neoplasm of breast: Secondary | ICD-10-CM | POA: Diagnosis not present

## 2024-02-27 LAB — HM MAMMOGRAPHY

## 2024-02-28 ENCOUNTER — Encounter: Payer: Self-pay | Admitting: Internal Medicine

## 2024-08-08 ENCOUNTER — Ambulatory Visit

## 2024-09-07 ENCOUNTER — Ambulatory Visit
